# Patient Record
Sex: Female | Born: 1960 | Race: White | Hispanic: No | Smoking: Former smoker
Health system: Southern US, Community
[De-identification: ages and names within clinical notes are randomized; demographics above are authoritative.]

## PROBLEM LIST (undated history)

## (undated) DIAGNOSIS — M3500 Sicca syndrome, unspecified: Secondary | ICD-10-CM

## (undated) DIAGNOSIS — M359 Systemic involvement of connective tissue, unspecified: Secondary | ICD-10-CM

## (undated) DIAGNOSIS — I10 Essential (primary) hypertension: Secondary | ICD-10-CM

## (undated) HISTORY — PX: DILATION AND CURETTAGE OF UTERUS: SHX78

## (undated) HISTORY — DX: Sjogren syndrome, unspecified: M35.00

## (undated) HISTORY — DX: Essential (primary) hypertension: I10

## (undated) HISTORY — DX: Systemic involvement of connective tissue, unspecified: M35.9

---

## 2006-02-26 ENCOUNTER — Encounter: Admission: RE | Admit: 2006-02-26 | Discharge: 2006-02-26 | Payer: Self-pay | Admitting: Rheumatology

## 2015-09-11 ENCOUNTER — Other Ambulatory Visit (HOSPITAL_COMMUNITY): Payer: Self-pay | Admitting: Rheumatology

## 2015-09-11 ENCOUNTER — Ambulatory Visit (HOSPITAL_COMMUNITY)
Admission: RE | Admit: 2015-09-11 | Discharge: 2015-09-11 | Disposition: A | Discharge status: Home / Self Care | Payer: BLUE CROSS/BLUE SHIELD | Source: Ambulatory Visit | Attending: Rheumatology | Admitting: Rheumatology

## 2015-09-11 DIAGNOSIS — Z139 Encounter for screening, unspecified: Secondary | ICD-10-CM

## 2016-03-04 DIAGNOSIS — D72819 Decreased white blood cell count, unspecified: Secondary | ICD-10-CM

## 2016-03-18 ENCOUNTER — Ambulatory Visit (INDEPENDENT_AMBULATORY_CARE_PROVIDER_SITE_OTHER): Payer: BLUE CROSS/BLUE SHIELD | Admitting: Rheumatology

## 2016-03-18 DIAGNOSIS — M359 Systemic involvement of connective tissue, unspecified: Secondary | ICD-10-CM

## 2016-03-18 DIAGNOSIS — M3500 Sicca syndrome, unspecified: Secondary | ICD-10-CM | POA: Diagnosis not present

## 2016-03-18 DIAGNOSIS — Z09 Encounter for follow-up examination after completed treatment for conditions other than malignant neoplasm: Secondary | ICD-10-CM

## 2016-03-18 DIAGNOSIS — L408 Other psoriasis: Secondary | ICD-10-CM

## 2016-03-18 DIAGNOSIS — M19041 Primary osteoarthritis, right hand: Secondary | ICD-10-CM | POA: Diagnosis not present

## 2016-08-05 ENCOUNTER — Other Ambulatory Visit: Payer: Self-pay | Admitting: Rheumatology

## 2016-08-05 LAB — CBC WITH DIFFERENTIAL/PLATELET
BASOS PCT: 1 %
Basophils Absolute: 39 cells/uL (ref 0–200)
EOS PCT: 2 %
Eosinophils Absolute: 78 cells/uL (ref 15–500)
HCT: 38.3 % (ref 35.0–45.0)
Hemoglobin: 12.7 g/dL (ref 11.7–15.5)
LYMPHS PCT: 32 %
Lymphs Abs: 1248 cells/uL (ref 850–3900)
MCH: 29.2 pg (ref 27.0–33.0)
MCHC: 33.2 g/dL (ref 32.0–36.0)
MCV: 88 fL (ref 80.0–100.0)
MONOS PCT: 10 %
MPV: 9.5 fL (ref 7.5–12.5)
Monocytes Absolute: 390 cells/uL (ref 200–950)
NEUTROS ABS: 2145 {cells}/uL (ref 1500–7800)
Neutrophils Relative %: 55 %
PLATELETS: 210 10*3/uL (ref 140–400)
RBC: 4.35 MIL/uL (ref 3.80–5.10)
RDW: 14 % (ref 11.0–15.0)
WBC: 3.9 10*3/uL (ref 3.8–10.8)

## 2016-08-05 LAB — COMPLETE METABOLIC PANEL WITH GFR
ALBUMIN: 4.4 g/dL (ref 3.6–5.1)
ALK PHOS: 59 U/L (ref 33–130)
ALT: 22 U/L (ref 6–29)
AST: 18 U/L (ref 10–35)
BILIRUBIN TOTAL: 0.5 mg/dL (ref 0.2–1.2)
BUN: 15 mg/dL (ref 7–25)
CALCIUM: 9.4 mg/dL (ref 8.6–10.4)
CO2: 28 mmol/L (ref 20–31)
CREATININE: 1.07 mg/dL — AB (ref 0.50–1.05)
Chloride: 104 mmol/L (ref 98–110)
GFR, EST AFRICAN AMERICAN: 67 mL/min (ref >=60)
GFR, EST NON AFRICAN AMERICAN: 58 mL/min — AB (ref >=60)
Glucose, Bld: 103 mg/dL — ABNORMAL HIGH (ref 65–99)
Potassium: 3.8 mmol/L (ref 3.5–5.3)
Sodium: 141 mmol/L (ref 135–146)
TOTAL PROTEIN: 7.5 g/dL (ref 6.1–8.1)

## 2016-08-06 LAB — URINALYSIS, ROUTINE W REFLEX MICROSCOPIC
BILIRUBIN URINE: NEGATIVE
GLUCOSE, UA: NEGATIVE
HGB URINE DIPSTICK: NEGATIVE
Ketones, ur: NEGATIVE
LEUKOCYTES UA: NEGATIVE
Nitrite: NEGATIVE
PROTEIN: NEGATIVE
Specific Gravity, Urine: 1.02 (ref 1.001–1.035)
pH: 6 (ref 5.0–8.0)

## 2016-08-26 ENCOUNTER — Encounter: Payer: Self-pay | Admitting: Rheumatology

## 2016-08-26 ENCOUNTER — Ambulatory Visit (INDEPENDENT_AMBULATORY_CARE_PROVIDER_SITE_OTHER): Payer: Managed Care, Other (non HMO) | Admitting: Rheumatology

## 2016-08-26 VITALS — BP 123/80 | HR 86 | Resp 14 | Ht 67.0 in | Wt 174.0 lb

## 2016-08-26 DIAGNOSIS — M25562 Pain in left knee: Secondary | ICD-10-CM | POA: Diagnosis not present

## 2016-08-26 DIAGNOSIS — M19042 Primary osteoarthritis, left hand: Secondary | ICD-10-CM | POA: Diagnosis not present

## 2016-08-26 DIAGNOSIS — L409 Psoriasis, unspecified: Secondary | ICD-10-CM | POA: Diagnosis not present

## 2016-08-26 DIAGNOSIS — Z8739 Personal history of other diseases of the musculoskeletal system and connective tissue: Secondary | ICD-10-CM

## 2016-08-26 DIAGNOSIS — Z79899 Other long term (current) drug therapy: Secondary | ICD-10-CM

## 2016-08-26 DIAGNOSIS — M19041 Primary osteoarthritis, right hand: Secondary | ICD-10-CM | POA: Diagnosis not present

## 2016-08-26 DIAGNOSIS — Z8679 Personal history of other diseases of the circulatory system: Secondary | ICD-10-CM | POA: Insufficient documentation

## 2016-08-26 DIAGNOSIS — Z8639 Personal history of other endocrine, nutritional and metabolic disease: Secondary | ICD-10-CM

## 2016-08-26 DIAGNOSIS — Z872 Personal history of diseases of the skin and subcutaneous tissue: Secondary | ICD-10-CM | POA: Diagnosis not present

## 2016-08-26 DIAGNOSIS — M359 Systemic involvement of connective tissue, unspecified: Secondary | ICD-10-CM

## 2016-08-26 DIAGNOSIS — M3500 Sicca syndrome, unspecified: Secondary | ICD-10-CM

## 2016-08-26 MED ORDER — HYDROXYCHLOROQUINE SULFATE 200 MG PO TABS: 200.0000 mg | ORAL_TABLET | Freq: Every day | ORAL | 1 refills | Status: AC

## 2016-08-26 NOTE — Progress Notes (Signed)
Office Visit Note  Patient: Suzanne [name]             Date of Birth: Nov 03, 1960           MRN: [id number]             PCP: Ronita Hipps, MD Referring: Ronita Hipps, MD Visit Date: 08/26/2016 Occupation: '@GUAROCC' @    Subjective:  Follow-up Follow-up on autoimmune disease, high risk prescription  History of Present Illness: Suzanne [name] is a 56 y.o. female  Last seen 03/18/2016. Patient is doing well with the autoimmune disease. Her main problem is her eyes. She states that she is on Lotemax which is helping her eyes. I advised the patient that that medicine is not for dry eyes. Then she corrected herself says that she is using Systane. She does not have any dry mouth problems.  She is using Plaquenil 200 mg daily. The last time she had it filled it cost her 200. We discussed her accident, and patient is able to get 180 pills for approximately $100.  Patient's Plaquenil eye exam is normal and up-to-date as of July 2017 and will be due again in July 2018.  Otherwise she is doing well and she doesn't have any other issues or concerns.  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    Activities of Daily Living:  Patient reports morning stiffness for 15 minutes.   Patient Denies nocturnal pain.  Difficulty dressing/grooming: Denies Difficulty climbing stairs: Denies Difficulty getting out of chair: Denies Difficulty using hands for taps, buttons, cutlery, and/or writing: Denies   Review of Systems  Constitutional: Negative for fatigue.  HENT: Negative for mouth sores and mouth dryness.   Eyes: Negative for dryness.  Respiratory: Negative for shortness of breath.   Gastrointestinal: Negative for constipation and diarrhea.  Musculoskeletal: Negative for myalgias and myalgias.  Skin: Negative for sensitivity to sunlight.  Psychiatric/Behavioral: Negative for decreased concentration  and sleep disturbance.    PMFS History:  Patient Active Problem List   Diagnosis Date Noted  . Autoimmune disease ([place]) 08/26/2016  . High risk medication use 08/26/2016  . Psoriasis 08/26/2016  . Primary osteoarthritis of both hands 08/26/2016  . History of Sjogren's disease 08/26/2016  . Pain, joint, knee, left 08/26/2016  . History of hypertension 08/26/2016  . H/O rosacea 08/26/2016    No past medical history on file.  No family history on file. No past surgical history on file. Social History   Social History Narrative  . No narrative on file     Objective: Vital Signs: BP 123/80   Pulse 86   Resp 14   Ht '5\' 7"'  (1.702 m)   Wt 174 lb (78.9 kg)   BMI 27.25 kg/m    Physical Exam  Constitutional: She is oriented to person, place, and time. She appears well-developed and well-nourished.  HENT:  Head: Normocephalic and atraumatic.  Eyes: EOM are normal. Pupils are equal, round, and reactive to light.  Cardiovascular: Normal rate, regular rhythm and normal heart sounds.  Exam reveals no gallop and no friction rub.   No murmur heard. Pulmonary/Chest: Effort normal and breath sounds normal. She has no wheezes. She has no rales.  Abdominal: Soft. Bowel sounds are normal. She exhibits no distension. There is no tenderness. There is no guarding. No hernia.  Musculoskeletal: Normal range of motion. She exhibits no edema, tenderness or deformity.  Lymphadenopathy:  She has no cervical adenopathy.  Neurological: She is alert and oriented to person, place, and time. Coordination normal.  Skin: Skin is warm and dry. Capillary refill takes less than 2 seconds. No rash noted.  Psychiatric: She has a normal mood and affect. Her behavior is normal.  Nursing note and vitals reviewed.    Musculoskeletal Exam:  Full range of motion of all joints Grip strength is equal and strong bilaterally Fibromyalgia tender points are all absent  CDAI Exam: CDAI Homunculus Exam:   Joint  Counts:  CDAI Tender Joint count: 0 CDAI Swollen Joint count: 0     Investigation: Findings:  Labs from 03/04/2016 show ANA is negative, ANA cytoplasmic antibodies are negative.  P-ANCA is less than 1:20.  C-ANCA is less than 1:20, atypical P-ANCA is less than 1:20.    CBC with diff is normal.  Sed rate is pending.  Note that these labs were ordered through her hematologist and they were done in [place].    March 18, 2016 we will go ahead and order CMP with GFR and urinalysis because they were not done.  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 Sodium 08/05/2016 141  135 - 146 mmol/L Final  . Potassium 08/05/2016 3.8  3.5 - 5.3 mmol/L Final  . Chloride 08/05/2016 104  98 - 110 mmol/L Final  . CO2 08/05/2016 28  20 - 31 mmol/L Final  . Glucose, Bld 08/05/2016 103* 65 - 99 mg/dL Final  . BUN 08/05/2016 15  7 - 25 mg/dL Final  . Creat 08/05/2016 1.07* 0.50 - 1.05 mg/dL Final   Comment:   For patients > or = 58 years of age: The upper reference limit for Creatinine is approximately 13% higher for people identified as African-American.     . Total Bilirubin 08/05/2016 0.5  0.2 - 1.2 mg/dL Final  . Alkaline Phosphatase 08/05/2016 59  33 - 130 U/L Final  . AST 08/05/2016 18  10 - 35 U/L Final  . ALT 08/05/2016 22  6 - 29 U/L Final  . Total Protein 08/05/2016 7.5  6.1 - 8.1 g/dL Final  . Albumin 08/05/2016 4.4  3.6 - 5.1 g/dL Final  . Calcium 08/05/2016 9.4  8.6 - 10.4 mg/dL Final  . GFR, Est African American 08/05/2016 67  >=60 mL/min Final  . GFR, Est Non African American 08/05/2016 58* >=60 mL/min Final  . WBC 08/05/2016 3.9  3.8 - 10.8 K/uL Final  . RBC 08/05/2016 4.35  3.80 - 5.10 MIL/uL Final  . Hemoglobin 08/05/2016 12.7  11.7 - 15.5 g/dL Final  . HCT 08/05/2016 38.3  35.0 - 45.0 % Final  . MCV 08/05/2016 88.0  80.0 - 100.0 fL Final  . MCH 08/05/2016 29.2  27.0 - 33.0  pg Final  . MCHC 08/05/2016 33.2  32.0 - 36.0 g/dL Final  . RDW 08/05/2016 14.0  11.0 - 15.0 % Final  . Platelets 08/05/2016 210  140 - 400 K/uL Final  . MPV 08/05/2016 9.5  7.5 - 12.5 fL Final  . Neutro Abs 08/05/2016 2145  1,500 - 7,800 cells/uL Final  . Lymphs Abs 08/05/2016 1248  850 - 3,900 cells/uL Final  . Monocytes Absolute 08/05/2016 390  200 - 950 cells/uL Final  . Eosinophils Absolute 08/05/2016 78  15 - 500 cells/uL Final  . Basophils Absolute 08/05/2016 39  0 - 200 cells/uL  Final  . Neutrophils Relative % 08/05/2016 55  % Final  . Lymphocytes Relative 08/05/2016 32  % Final  . Monocytes Relative 08/05/2016 10  % Final  . Eosinophils Relative 08/05/2016 2  % Final  . Basophils Relative 08/05/2016 1  % Final  . Smear Review 08/05/2016 Criteria for review not met   Final  . Color, Urine 08/05/2016 YELLOW  YELLOW Final  . APPearance 08/05/2016 CLEAR  CLEAR Final  . Specific Gravity, Urine 08/05/2016 1.020  1.001 - 1.035 Final  . pH 08/05/2016 6.0  5.0 - 8.0 Final  . Glucose, UA 08/05/2016 NEGATIVE  NEGATIVE Final  . Bilirubin Urine 08/05/2016 NEGATIVE  NEGATIVE Final  . Ketones, ur 08/05/2016 NEGATIVE  NEGATIVE Final  . Hgb urine dipstick 08/05/2016 NEGATIVE  NEGATIVE Final  . Protein, ur 08/05/2016 NEGATIVE  NEGATIVE Final  . Nitrite 08/05/2016 NEGATIVE  NEGATIVE Final  . Leukocytes, UA 08/05/2016 NEGATIVE  NEGATIVE Final     Imaging: No results found.  Speciality Comments: No specialty comments available.    Procedures:  No procedures performed Allergies: Patient has no known allergies.   Assessment / Plan:     Visit Diagnoses: Autoimmune disease ([place]) - Positive Ro.  High risk medication use - PLQ- 257m daily  Psoriasis  Primary osteoarthritis of both hands  History of Sjogren's disease  Pain, joint, knee, left  History of hypertension  History of vitamin D deficiency  H/O rosacea   Plan: #1: Autoimmune disease. Patient SIvin Bootyis well  controlled overall except does need symptom relief with sustained for her dry eyes. Her dry mouth is not much of an issue.  #2: High risk prescription. Plaquenil 200 mg once a day. Patient is getting adequate control with once a day Plaquenil. Her [facility] and is due again July 2018.  #3: OA of the hands. Mild occasional discomfort.  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 I gave her 90 day supply of Plaquenil 20 mg daily with 1 refill. I also give her good Rx card  #6: Patient will do a CBC with differential, CMP with GFR, urinalysis upon her return in 5 months.  Orders: No orders of the defined types were placed in this encounter.  Meds ordered this encounter  Medications  . hydroxychloroquine (PLAQUENIL) 200 MG tablet    Sig: Take 1 tablet (200 mg total) by mouth daily.    Dispense:  90 tablet    Refill:  1    Order Specific Question:   Supervising Provider    Answer:   DBo Merino[[phone]   Face-to-face time spent with patient was 30 minutes. 50% of time was spent in counseling and coordination of care.  Follow-Up Instructions: Return in about 5 months (around 01/26/2017).   NEliezer Lofts PA-C  Note - This record has been created using [employer]  Chart creation errors have been sought, but may not always  have been located. Such creation errors do not reflect on  the standard of medical care.

## 2017-01-27 ENCOUNTER — Ambulatory Visit: Payer: Managed Care, Other (non HMO) | Admitting: Rheumatology

## 2017-03-26 NOTE — Progress Notes (Signed)
Office Visit Note  Patient: Suzanne [name]             Date of Birth: April 13, 1961           MRN: [id number]             PCP: Marylen Ponto, MD Referring: Marylen Ponto, MD Visit Date: 04/07/2017 Occupation: @    Subjective:  This stiffness in joints.   History of Present Illness: Suzanne [name] is a 56 y.o. female with history of autoimmune disease and osteoarthritis. She states she's been having some discomfort in her bilateral shoulders area and she also has discomfort in her right knee joint when she gets up from the chair. She denies any joint swelling. Her hands are doing okay currently.  Activities of Daily Living:  Patient reports morning stiffness for 15 minutes.   Patient Denies nocturnal pain.  Difficulty dressing/grooming: Denies Difficulty climbing stairs: Denies Difficulty getting out of chair: Denies Difficulty using hands for taps, buttons, cutlery, and/or writing: Denies   Review of Systems  Constitutional: Positive for fatigue. Negative for night sweats, weight gain, weight loss and weakness.  HENT: Negative.  Negative for mouth sores, trouble swallowing, trouble swallowing, mouth dryness and nose dryness.   Eyes: Positive for dryness. Negative for pain, redness and visual disturbance.  Respiratory: Negative for cough, shortness of breath and difficulty breathing.   Cardiovascular: Positive for hypertension. Negative for chest pain, palpitations, irregular heartbeat and swelling in legs/feet.  Gastrointestinal: Positive for constipation. Negative for blood in stool and diarrhea.  Endocrine: Negative for increased urination.  Genitourinary: Negative for vaginal dryness.  Musculoskeletal: Positive for arthralgias, joint pain and morning stiffness. Negative for joint swelling, myalgias, muscle weakness, muscle tenderness and myalgias.  Skin: Positive for rash. Negative for color change, hair loss, skin tightness, ulcers and sensitivity to sunlight.         psoriasis  Allergic/Immunologic: Negative for susceptible to infections.  Neurological: Positive for headaches. Negative for dizziness, numbness, memory loss and night sweats.  Hematological: Negative for swollen glands.  Psychiatric/Behavioral: Positive for depressed mood and sleep disturbance. The patient is not nervous/anxious.     PMFS History:  Patient Active Problem List   Diagnosis Date Noted  . Autoimmune disease ([facility]) 08/26/2016  . High risk medication use 08/26/2016  . Psoriasis 08/26/2016  . Primary osteoarthritis of both hands 08/26/2016  . History of Sjogren's disease 08/26/2016  . Pain, joint, knee, left 08/26/2016  . History of hypertension 08/26/2016  . H/O rosacea 08/26/2016    No past medical history on file.  No family history on file. No past surgical history on file. Social History   Social History Narrative  . No narrative on file     Objective: Vital Signs: BP 127/66 (BP Location: Left Arm, Patient Position: Sitting, Cuff Size: Normal)   Pulse (!) 59   Ht  (1.702 m)   Wt 172 lb (78 kg)   BMI 26.94 kg/m    Physical Exam  Constitutional: She is oriented to person, place, and time. She appears well-developed and well-nourished.  HENT:  Head: Normocephalic and atraumatic.  Eyes: Conjunctivae and EOM are normal.  Neck: Normal range of motion.  Cardiovascular: Normal rate, regular rhythm, normal heart sounds and intact distal pulses.   Pulmonary/Chest: Effort normal and breath sounds normal.  Abdominal: Soft. Bowel sounds are normal.  Lymphadenopathy:    She has no cervical adenopathy.  Neurological: She is alert and oriented to person, place,  and time.  Skin: Skin is warm and dry. Capillary refill takes less than 2 seconds.  Psychiatric: She has a normal mood and affect. Her behavior is normal.  Nursing note and vitals reviewed.    Musculoskeletal Exam: C-spine and thoracic lumbar spine good range of motion. Shoulder joints elbow  joints wrist joints are good range of motion. MCPs PIPs DIPs with good range of motion with no synovitis. Hip joints knee joints ankles MTPs PIPs DIPs with good range of motion with no synovitis.  CDAI Exam: CDAI Homunculus Exam:   Joint Counts:  CDAI Tender Joint count: 0 CDAI Swollen Joint count: 0  Global Assessments:  Patient Global Assessment: 3 Provider Global Assessment: 1  CDAI Calculated Score: 4    Investigation: Findings:  02/24/2017 CBC WBC 2.8 hemoglobin 12.3 platelets 189, CMP globulin low at 3.3, lipid panel LDL 118, HDL 46 02/03/2017 I exam by Dr. Georgann Housekeeper No evidence of Plaquenil toxicity. PLQ eye exam? CBC Latest Ref Rng & Units 08/05/2016  WBC 3.8 - 10.8 K/uL 3.9  Hemoglobin 11.7 - 15.5 g/dL 45.4  Hematocrit 09.8 - 45.0 % 38.3  Platelets 140 - 400 K/uL 210   CMP Latest Ref Rng & Units 08/05/2016  Glucose 65 - 99 mg/dL 119(J)  BUN 7 - 25 mg/dL 15  Creatinine 4.78 - 2.95 mg/dL 6.21(H)  Sodium 086 - 578 mmol/L 141  Potassium 3.5 - 5.3 mmol/L 3.8  Chloride 98 - 110 mmol/L 104  CO2 20 - 31 mmol/L 28  Calcium 8.6 - 10.4 mg/dL 9.4  Total Protein 6.1 - 8.1 g/dL 7.5  Total Bilirubin 0.2 - 1.2 mg/dL 0.5  Alkaline Phos 33 - 130 U/L 59  AST 10 - 35 U/L 18  ALT 6 - 29 U/L 22    Imaging: No results found.  Speciality Comments: No specialty comments available.    Procedures:  No procedures performed Allergies: Patient has no known allergies.   Assessment / Plan:     Visit Diagnoses: Autoimmune disease ([facility]) - +ANA, +RF, +RO, +LA, +ACE. h/o sicca symptoms, malar rash, fatigue, neutropenia. Patient is clinically doing well. She continues to have some fatigue. I will obtain following labs today.  High risk medication use - PLQ 200 mg by mouth daily. Her last eye exam 02/11/2017 was normal. She has neutropenia on the recent labs. Her to PBC count usually fluctuates.  Psoriasis: She currently does not have any active lesions.  Primary osteoarthritis of  both hands: She has some stiffness but not very symptomatic.  Right knee pain: She has no warmth swelling or effusion on examination. Some knee joint and muscle strengthening exercises were given.  History of vitamin D deficiency: She's been taking supplements and will check the value today.  History of hypertension: Her blood pressure is controlled.   History of rosacea    Orders: Orders Placed This Encounter  Procedures  . Serum protein electrophoresis with reflex  . Rheumatoid factor  . C3 and C4  . ANA  . Urinalysis, Routine w reflex microscopic  . VITAMIN D 25 Hydroxy (Vit-D Deficiency, Fractures)  . Anti-DNA antibody, double-stranded  . Sedimentation rate  . CBC with Differential/Platelet  . COMPLETE METABOLIC PANEL WITH GFR   Meds ordered this encounter  Medications  . hydroxychloroquine (PLAQUENIL) 200 MG tablet    Sig: Take 1 tablet (200 mg total) by mouth daily.    Dispense:  90 tablet    Refill:  1    Face-to-face time spent with patient  was 30 minutes. Greater than 50% of time was spent in counseling and coordination of care.  Follow-Up Instructions: Return in about 5 months (around 09/05/2017) for Autoimmune disease.   Pollyann Savoy, MD  Note - This record has been created using [employer].  Chart creation errors have been sought, but may not always  have been located. Such creation errors do not reflect on  the standard of medical care.

## 2017-04-07 ENCOUNTER — Encounter: Payer: Self-pay | Admitting: Rheumatology

## 2017-04-07 ENCOUNTER — Encounter (INDEPENDENT_AMBULATORY_CARE_PROVIDER_SITE_OTHER): Payer: Self-pay

## 2017-04-07 ENCOUNTER — Ambulatory Visit (INDEPENDENT_AMBULATORY_CARE_PROVIDER_SITE_OTHER): Payer: Managed Care, Other (non HMO) | Admitting: Rheumatology

## 2017-04-07 VITALS — BP 127/66 | HR 59 | Ht 67.0 in | Wt 172.0 lb

## 2017-04-07 DIAGNOSIS — D8989 Other specified disorders involving the immune mechanism, not elsewhere classified: Secondary | ICD-10-CM

## 2017-04-07 DIAGNOSIS — L409 Psoriasis, unspecified: Secondary | ICD-10-CM | POA: Diagnosis not present

## 2017-04-07 DIAGNOSIS — G8929 Other chronic pain: Secondary | ICD-10-CM | POA: Diagnosis not present

## 2017-04-07 DIAGNOSIS — R5383 Other fatigue: Secondary | ICD-10-CM

## 2017-04-07 DIAGNOSIS — Z8679 Personal history of other diseases of the circulatory system: Secondary | ICD-10-CM | POA: Diagnosis not present

## 2017-04-07 DIAGNOSIS — Z872 Personal history of diseases of the skin and subcutaneous tissue: Secondary | ICD-10-CM

## 2017-04-07 DIAGNOSIS — M25561 Pain in right knee: Secondary | ICD-10-CM

## 2017-04-07 DIAGNOSIS — Z79899 Other long term (current) drug therapy: Secondary | ICD-10-CM | POA: Diagnosis not present

## 2017-04-07 DIAGNOSIS — Z8639 Personal history of other endocrine, nutritional and metabolic disease: Secondary | ICD-10-CM

## 2017-04-07 DIAGNOSIS — M19042 Primary osteoarthritis, left hand: Secondary | ICD-10-CM | POA: Diagnosis not present

## 2017-04-07 DIAGNOSIS — M19041 Primary osteoarthritis, right hand: Secondary | ICD-10-CM

## 2017-04-07 DIAGNOSIS — M359 Systemic involvement of connective tissue, unspecified: Secondary | ICD-10-CM

## 2017-04-07 MED ORDER — HYDROXYCHLOROQUINE SULFATE 200 MG PO TABS: 200.0000 mg | ORAL_TABLET | Freq: Every day | ORAL | 1 refills | Status: DC

## 2017-04-07 NOTE — Patient Instructions (Addendum)
Standing Labs We placed an order today for your standing lab work.    Please come back and get your standing labs in [place] Knee Exercises Ask your health care provider which exercises are safe for you. Do exercises exactly as told by your health care provider and adjust them as directed. It is normal to feel mild stretching, pulling, tightness, or discomfort as you do these exercises, but you should stop right away if you feel sudden pain or your pain gets worse.Do not begin these exercises until told by your health care provider. STRETCHING AND RANGE OF MOTION EXERCISES These exercises warm up your muscles and joints and improve the movement and flexibility of your knee. These exercises also help to relieve pain, numbness, and tingling. Exercise A: Knee Extension, Prone 1. Lie on your abdomen on a bed. 2. Place your left / right knee just beyond the edge of the surface so your knee is not on the bed. You can put a towel under your left / right thigh just above your knee for comfort. 3. Relax your leg muscles and allow gravity to straighten your knee. You should feel a stretch behind your left / right knee. 4. Hold this position for __________ seconds. 5. Scoot up so your knee is supported between repetitions. Repeat __________ times. Complete this stretch __________ times a day. Exercise B: Knee Flexion, Active  1. Lie on your back with both knees straight. If this causes back discomfort, bend your left / right knee so your foot is flat on the floor. 2. Slowly slide your left / right heel back toward your buttocks until you feel a gentle stretch in the front of your knee or thigh. 3. Hold this position for __________ seconds. 4. Slowly slide your left / right heel back to the starting position. Repeat __________ times. Complete this exercise __________ times a day. Exercise C: Quadriceps, Prone  1. Lie on your abdomen on a firm surface, such as a bed or padded floor. 2. Bend your left / right  knee and hold your ankle. If you cannot reach your ankle or pant leg, loop a belt around your foot and grab the belt instead. 3. Gently pull your heel toward your buttocks. Your knee should not slide out to the side. You should feel a stretch in the front of your thigh and knee. 4. Hold this position for __________ seconds. Repeat __________ times. Complete this stretch __________ times a day. Exercise D: Hamstring, Supine 1. Lie on your back. 2. Loop a belt or towel over the ball of your left / right foot. The ball of your foot is on the walking surface, right under your toes. 3. Straighten your left / right knee and slowly pull on the belt to raise your leg until you feel a gentle stretch behind your knee. ? Do not let your left / right knee bend while you do this. ? Keep your other leg flat on the floor. 4. Hold this position for __________ seconds. Repeat __________ times. Complete this stretch __________ times a day. STRENGTHENING EXERCISES These exercises build strength and endurance in your knee. Endurance is the ability to use your muscles for a long time, even after they get tired. Exercise E: Quadriceps, Isometric  1. Lie on your back with your left / right leg extended and your other knee bent. Put a rolled towel or small pillow under your knee if told by your health care provider. 2. Slowly tense the muscles in the front  of your left / right thigh. You should see your kneecap slide up toward your hip or see increased dimpling just above the knee. This motion will push the back of the knee toward the floor. 3. For __________ seconds, keep the muscle as tight as you can without increasing your pain. 4. Relax the muscles slowly and completely. Repeat __________ times. Complete this exercise __________ times a day. Exercise F: Straight Leg Raises - Quadriceps 1. Lie on your back with your left / right leg extended and your other knee bent. 2. Tense the muscles in the front of your left  / right thigh. You should see your kneecap slide up or see increased dimpling just above the knee. Your thigh may even shake a bit. 3. Keep these muscles tight as you raise your leg 4-6 inches (10-15 cm) off the floor. Do not let your knee bend. 4. Hold this position for __________ seconds. 5. Keep these muscles tense as you lower your leg. 6. Relax your muscles slowly and completely after each repetition. Repeat __________ times. Complete this exercise __________ times a day. Exercise G: Hamstring, Isometric 1. Lie on your back on a firm surface. 2. Bend your left / right knee approximately __________ degrees. 3. Dig your left / right heel into the surface as if you are trying to pull it toward your buttocks. Tighten the muscles in the back of your thighs to dig as hard as you can without increasing any pain. 4. Hold this position for __________ seconds. 5. Release the tension gradually and allow your muscles to relax completely for __________ seconds after each repetition. Repeat __________ times. Complete this exercise __________ times a day. Exercise H: Hamstring Curls  If told by your health care provider, do this exercise while wearing ankle weights. Begin with __________ weights. Then increase the weight by 1 lb (0.5 kg) increments. Do not wear ankle weights that are more than __________. 1. Lie on your abdomen with your legs straight. 2. Bend your left / right knee as far as you can without feeling pain. Keep your hips flat against the floor. 3. Hold this position for __________ seconds. 4. Slowly lower your leg to the starting position.  Repeat __________ times. Complete this exercise __________ times a day. Exercise I: Squats (Quadriceps) 1. Stand in front of a table, with your feet and knees pointing straight ahead. You may rest your hands on the table for balance but not for support. 2. Slowly bend your knees and lower your hips like you are going to sit in a chair. ? Keep your  weight over your heels, not over your toes. ? Keep your lower legs upright so they are parallel with the table legs. ? Do not let your hips go lower than your knees. ? Do not bend lower than told by your health care provider. ? If your knee pain increases, do not bend as low. 3. Hold the squat position for __________ seconds. 4. Slowly push with your legs to return to standing. Do not use your hands to pull yourself to standing. Repeat __________ times. Complete this exercise __________ times a day. Exercise J: Wall Slides (Quadriceps)  1. Lean your back against a smooth wall or door while you walk your feet out 18-24 inches (46-61 cm) from it. 2. Place your feet hip-width apart. 3. Slowly slide down the wall or door until your knees bend __________ degrees. Keep your knees over your heels, not over your toes. Keep your knees in  line with your hips. 4. Hold for __________ seconds. Repeat __________ times. Complete this exercise __________ times a day. Exercise K: Straight Leg Raises - Hip Abductors 1. Lie on your side with your left / right leg in the top position. Lie so your head, shoulder, knee, and hip line up. You may bend your bottom knee to help you keep your balance. 2. Roll your hips slightly forward so your hips are stacked directly over each other and your left / right knee is facing forward. 3. Leading with your heel, lift your top leg 4-6 inches (10-15 cm). You should feel the muscles in your outer hip lifting. ? Do not let your foot drift forward. ? Do not let your knee roll toward the ceiling. 4. Hold this position for __________ seconds. 5. Slowly return your leg to the starting position. 6. Let your muscles relax completely after each repetition. Repeat __________ times. Complete this exercise __________ times a day. Exercise L: Straight Leg Raises - Hip Extensors 1. Lie on your abdomen on a firm surface. You can put a pillow under your hips if that is more  comfortable. 2. Tense the muscles in your buttocks and lift your left / right leg about 4-6 inches (10-15 cm). Keep your knee straight as you lift your leg. 3. Hold this position for __________ seconds. 4. Slowly lower your leg to the starting position. 5. Let your leg relax completely after each repetition. Repeat __________ times. Complete this exercise __________ times a day. This information is not intended to replace advice given to you by your health care provider. Make sure you discuss any questions you have with your health care provider. Document Released: 04/17/2005 Document Revised: 02/26/2016 Document Reviewed: 04/09/2015 Elsevier Interactive Patient Education  2018 [employer]. [place]  We have open lab Monday through Friday from 8:30-11:30 AM and 1:30-4 PM at the office of Dr. Pollyann Savoy.   The office is located at [address], [place], [place] [address] No appointment is necessary.   Labs are drawn by [facility].  You may receive a bill from [facility] for your lab work. If you have any questions regarding directions or hours of operation,  please call [phone].

## 2017-04-11 LAB — URINALYSIS, ROUTINE W REFLEX MICROSCOPIC
BACTERIA UA: NONE SEEN /HPF
BILIRUBIN URINE: NEGATIVE
Glucose, UA: NEGATIVE
HGB URINE DIPSTICK: NEGATIVE
Hyaline Cast: NONE SEEN /LPF
NITRITE: NEGATIVE
PH: 5.5 (ref 5.0–8.0)
Protein, ur: NEGATIVE
RBC / HPF: NONE SEEN /HPF (ref 0–2)
SPECIFIC GRAVITY, URINE: 1.024 (ref 1.001–1.03)

## 2017-04-11 LAB — VITAMIN D 25 HYDROXY (VIT D DEFICIENCY, FRACTURES): Vit D, 25-Hydroxy: 47 ng/mL (ref 30–100)

## 2017-04-11 LAB — SEDIMENTATION RATE: Sed Rate: 25 mm/h (ref 0–30)

## 2017-04-11 LAB — PROTEIN ELECTROPHORESIS, SERUM, WITH REFLEX
ALPHA 1: 0.3 g/dL (ref 0.2–0.3)
Albumin ELP: 4.7 g/dL (ref 3.8–4.8)
Alpha 2: 0.7 g/dL (ref 0.5–0.9)
BETA 2: 0.3 g/dL (ref 0.2–0.5)
Beta Globulin: 0.5 g/dL (ref 0.4–0.6)
GAMMA GLOBULIN: 1.8 g/dL — AB (ref 0.8–1.7)
Total Protein: 8.2 g/dL — ABNORMAL HIGH (ref 6.1–8.1)

## 2017-04-11 LAB — ANA: ANA: POSITIVE — AB

## 2017-04-11 LAB — ANTI-NUCLEAR AB-TITER (ANA TITER): ANA Titer 1: 1:80 {titer} — ABNORMAL HIGH

## 2017-04-11 LAB — C3 AND C4
C3 COMPLEMENT: 132 mg/dL (ref 83–193)
C4 COMPLEMENT: 28 mg/dL (ref 15–57)

## 2017-04-11 LAB — ANTI-DNA ANTIBODY, DOUBLE-STRANDED: DS DNA AB: 1 [IU]/mL

## 2017-04-11 LAB — RHEUMATOID FACTOR: Rhuematoid fact SerPl-aCnc: <14 IU/mL (ref <14)

## 2017-04-11 LAB — IFE INTERPRETATION: Immunofix Electr Int: NOT DETECTED

## 2017-04-11 NOTE — Progress Notes (Signed)
Labs are stable. Do not indicate autoimmune flare. No change in therapy recommended.

## 2017-07-14 IMAGING — DX DG CHEST 2V
2 series · 2 of 2 positions shown · non-contrast
Comparison: 02/26/2006

CLINICAL DATA: Screening.  Beginning immunosuppressive therapy

EXAM:
CHEST  2 VIEW

[w chest pa]
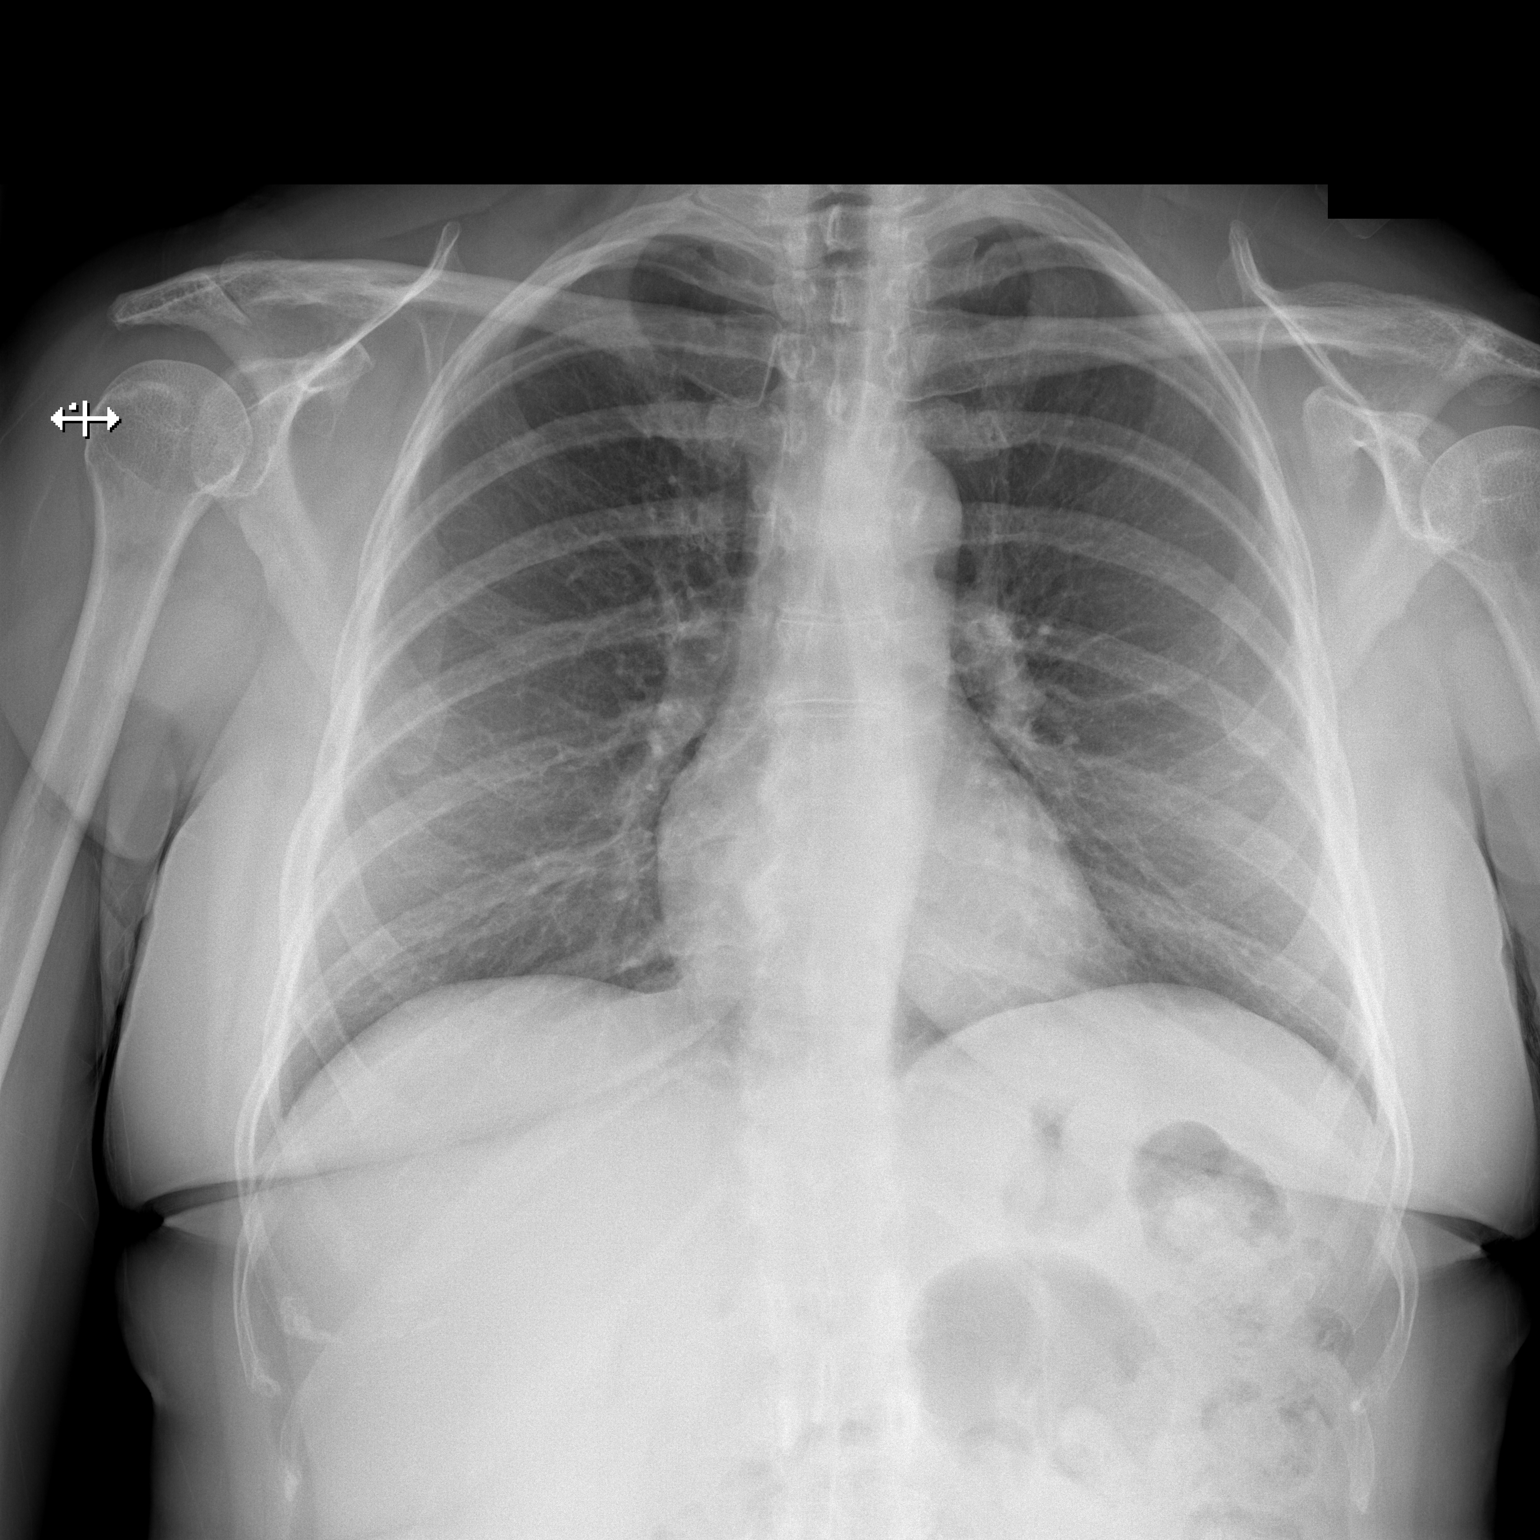

[w chest lat]
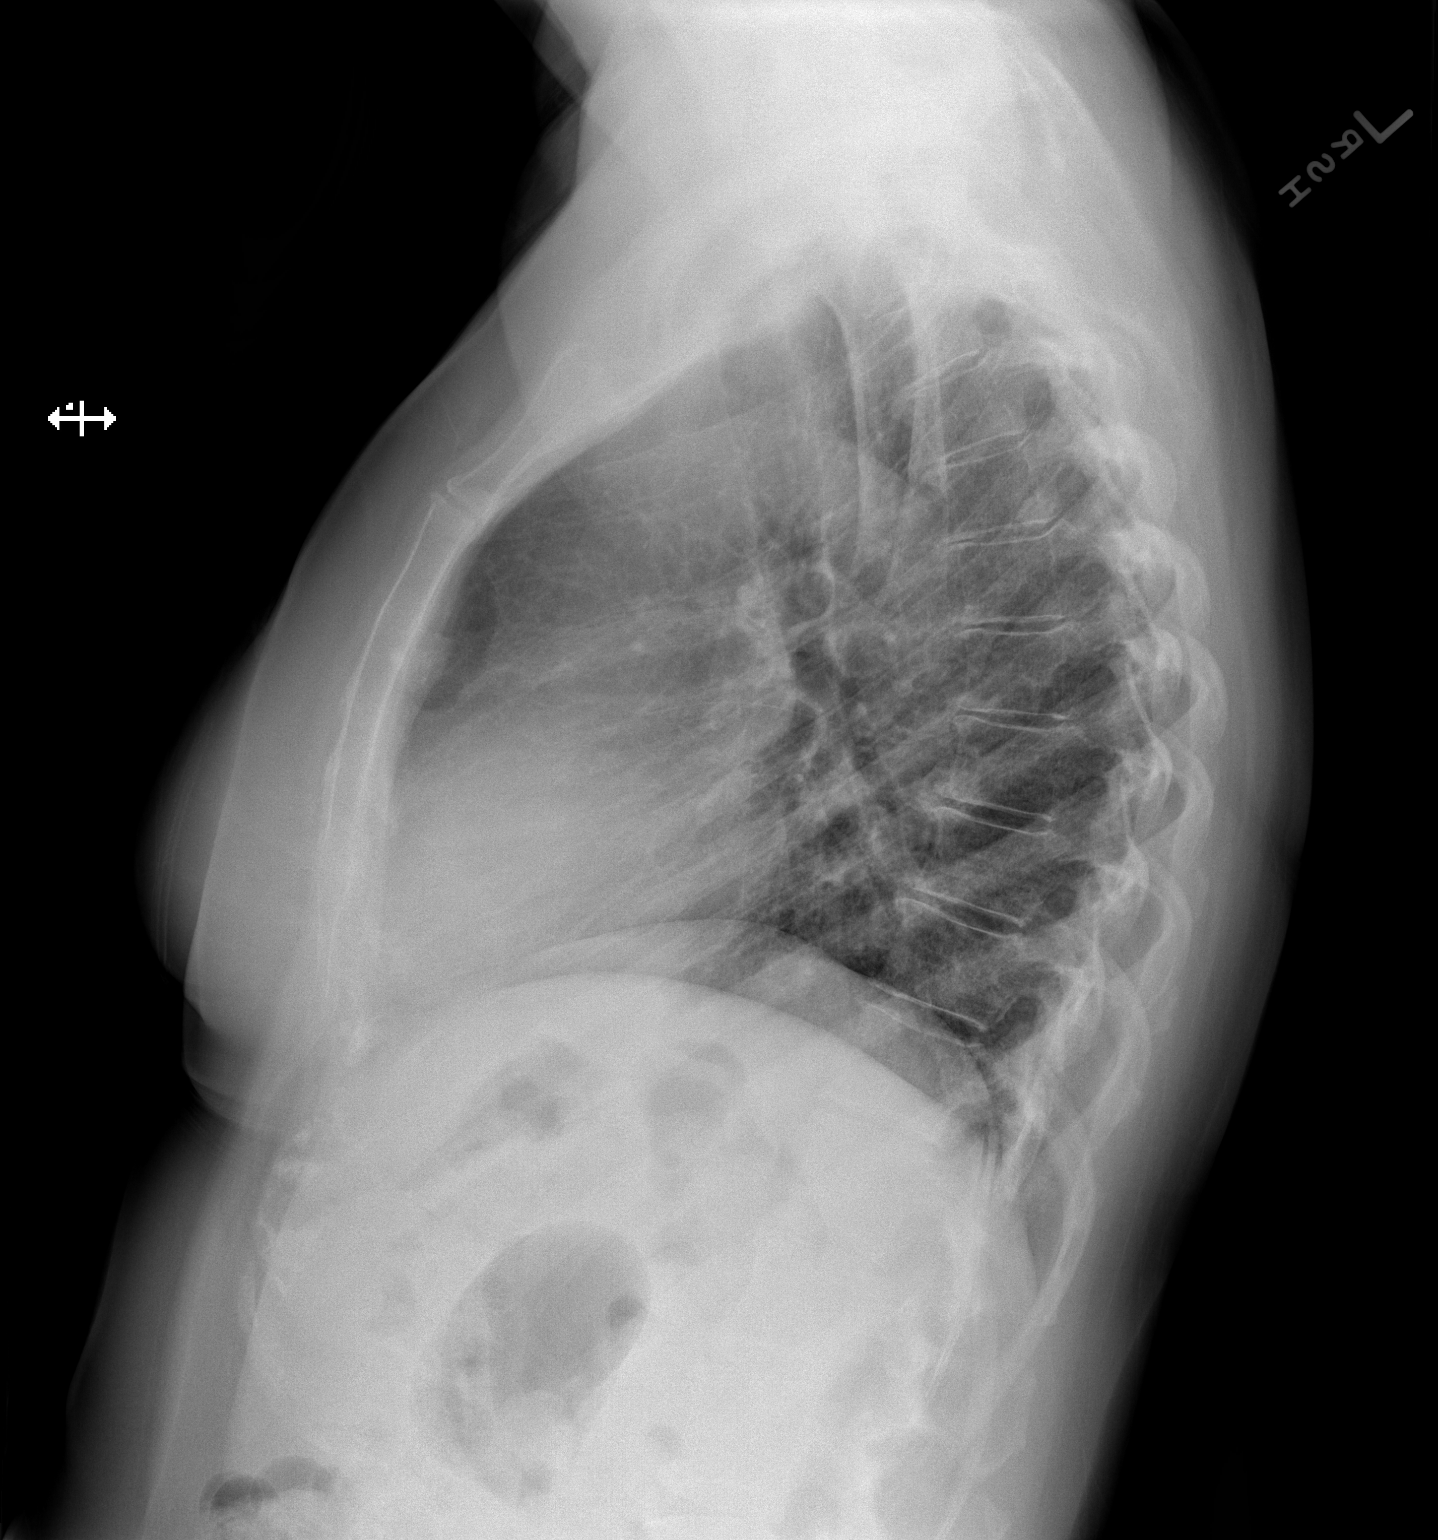

[2 of 2 positions shown; findings below may reference images not displayed]

FINDINGS: Heart and mediastinal contours are within normal limits. No focal
opacities or effusions. No acute bony abnormality.
IMPRESSION: No active cardiopulmonary disease.

## 2017-08-25 NOTE — Progress Notes (Signed)
Office Visit Note  Patient: Suzanne [name]             Date of Birth: 10/27/1960           MRN: [id number]             PCP: Ronita Hipps, MD Referring: Ronita Hipps, MD Visit Date: 09/08/2017 Occupation: '@GUAROCC' @    Subjective:  Medication Management   History of Present Illness: Suzanne [name] is a 57 y.o. female with history of autoimmune disease and psoriasis.  She states she started having some pain in the coccyx area about 3 months ago.  She has been having increased discomfort in the lower back in the coccyx region for the last 2 days.  She has done some activities in the house which could have triggered the pain.  He denies any joint swelling.  There is no increased discomfort in her hands.  He denies any new psoriasis lesions.  She continues to have some rash on her face and dry eyes.  Activities of Daily Living:  Patient reports morning stiffness for 30 minutes.   Patient Denies nocturnal pain.  Difficulty dressing/grooming: Denies Difficulty climbing stairs: Denies Difficulty getting out of chair: Denies Difficulty using hands for taps, buttons, cutlery, and/or writing: Denies   Review of Systems  Constitutional: Positive for fatigue. Negative for night sweats, weight gain and weight loss.  HENT: Negative for mouth sores, trouble swallowing, trouble swallowing, mouth dryness and nose dryness.   Eyes: Positive for dryness. Negative for pain, redness and visual disturbance.  Respiratory: Negative for cough, shortness of breath and difficulty breathing.   Cardiovascular: Negative for chest pain, palpitations, hypertension, irregular heartbeat and swelling in legs/feet.  Gastrointestinal: Positive for constipation. Negative for blood in stool and diarrhea.  Endocrine: Negative for increased urination.  Genitourinary: Negative for vaginal dryness.  Musculoskeletal: Positive for arthralgias, joint pain and morning stiffness. Negative for joint swelling, myalgias,  muscle weakness, muscle tenderness and myalgias.  Skin: Positive for rash. Negative for color change, hair loss, skin tightness, ulcers and sensitivity to sunlight.  Allergic/Immunologic: Negative for susceptible to infections.  Neurological: Negative for dizziness, memory loss, night sweats and weakness.  Hematological: Negative for swollen glands.  Psychiatric/Behavioral: Positive for depressed mood and sleep disturbance. The patient is nervous/anxious.     PMFS History:  Patient Active Problem List   Diagnosis Date Noted  . Autoimmune disease ([place]) 08/26/2016  . High risk medication use 08/26/2016  . Psoriasis 08/26/2016  . Primary osteoarthritis of both hands 08/26/2016  . History of Sjogren's disease 08/26/2016  . Pain, joint, knee, left 08/26/2016  . History of hypertension 08/26/2016  . H/O rosacea 08/26/2016    Past Medical History:  Diagnosis Date  . Hypertension     Family History  Problem Relation Age of Onset  . Hypertension Mother   . Stroke Mother   . Hypercholesterolemia Mother   . Cancer Father    History reviewed. No pertinent surgical history. Social History   Social History Narrative  . Not on file     Objective: Vital Signs: BP (!) 155/76 (BP Location: Left Arm, Patient Position: Sitting, Cuff Size: Large)   Pulse 66   Resp 14   Ht '5\' 7"'  (1.702 m)   Wt 182 lb (82.6 kg)   BMI 28.51 kg/m    Physical Exam  Constitutional: She is oriented to person, place, and time. She appears well-developed and well-nourished.  HENT:  Head: Normocephalic and atraumatic.  Eyes: Conjunctivae and EOM are normal.  Neck: Normal range of motion.  Cardiovascular: Normal rate, regular rhythm, normal heart sounds and intact distal pulses.  Pulmonary/Chest: Effort normal and breath sounds normal.  Abdominal: Soft. Bowel sounds are normal.  Lymphadenopathy:    She has no cervical adenopathy.  Neurological: She is alert and oriented to person, place, and time.  Skin:  Skin is warm and dry. Capillary refill takes less than 2 seconds. There is erythema.  Over face  Psychiatric: She has a normal mood and affect. Her behavior is normal.  Nursing note and vitals reviewed.    Musculoskeletal Exam: C-spine thoracic lumbar spine good range of motion.  Shoulder joints elbow joints wrist joint MCPs PIPs DIPs with good range of motion.  She has mild DIP thickening in her hands.  She has some discomfort on palpation over her coccyx and sacral region.  Hip joints knee joints ankles MTPs PIPs with good range of motion with no synovitis.  CDAI Exam: No CDAI exam completed.    Investigation: No additional findings.  CBC    Component Value Date/Time   WBC 3.9 08/05/2016 1240   RBC 4.35 08/05/2016 1240   HGB 12.7 08/05/2016 1240   HCT 38.3 08/05/2016 1240   PLT 210 08/05/2016 1240   MCV 88.0 08/05/2016 1240   MCH 29.2 08/05/2016 1240   MCHC 33.2 08/05/2016 1240   RDW 14.0 08/05/2016 1240   LYMPHSABS 1,248 08/05/2016 1240   MONOABS 390 08/05/2016 1240   EOSABS 78 08/05/2016 1240   BASOSABS 39 08/05/2016 1240   CMP     Component Value Date/Time   NA 141 08/05/2016 1240   K 3.8 08/05/2016 1240   CL 104 08/05/2016 1240   CO2 28 08/05/2016 1240   GLUCOSE 103 (H) 08/05/2016 1240   BUN 15 08/05/2016 1240   CREATININE 1.07 (H) 08/05/2016 1240   CALCIUM 9.4 08/05/2016 1240   PROT 8.2 (H) 04/07/2017 0845   ALBUMIN 4.4 08/05/2016 1240   AST 18 08/05/2016 1240   ALT 22 08/05/2016 1240   ALKPHOS 59 08/05/2016 1240   BILITOT 0.5 08/05/2016 1240   GFRNONAA 58 (L) 08/05/2016 1240   GFRAA 67 08/05/2016 1240  UA showed few WBCs but no bacteria, ANA 1: 80 speckled, dsDNA negative, C3-C4 normal, ESR 25 normal, RF negative, vitamin D 47, IFE normal PLQ eye exam: 02/03/2017 Imaging: No results found.  Speciality Comments: No specialty comments available.    Procedures:  No procedures performed Allergies: Patient has no known allergies.   Assessment / Plan:      Visit Diagnoses: Autoimmune disease ([place]) - +ANA, +RF, +RO, +LA, +ACE. h/o sicca symptoms, malar rash, fatigue, neutropenia -she continues to have some malar rash and sicca symptoms.  Fatigue continues.  Her last labs did not show any flare of autoimmune disease.  Plan: Urinalysis, Routine w reflex microscopic, Anti-DNA antibody, double-stranded, C3 and C4, Sedimentation rate  High risk medication use - PLQ 200 mg by mouth daily.  She is on lower dose secondary to neutropenia in the past.  I will check her labs again today.  Coccygodynia no injury but having some coccygodynia.  Advised to sit on the cushion.  If her symptoms persist she will notify us.  Primary osteoarthritis of both hands: Joint protection was discussed.  Psoriasis: She is occasional patches.  History of vitamin D deficiency -she is on supplement.  Plan: VITAMIN D 25 Hydroxy (Vit-D Deficiency, Fractures)  H/O rosacea  History of hypertension: Her  blood pressure is elevated today.  She will followed closely.   Orders: Orders Placed This Encounter  Procedures  . Urinalysis, Routine w reflex microscopic  . Anti-DNA antibody, double-stranded  . C3 and C4  . Sedimentation rate  . CBC with Differential/Platelet  . COMPLETE METABOLIC PANEL WITH GFR   No orders of the defined types were placed in this encounter.   Face-to-face time spent with patient was 30 minutes.  Greater than 50% of time was spent in counseling and coordination of care.  Follow-Up Instructions: Return in about 5 months (around 02/08/2018) for Autoimmune disease, Osteoarthritis,Ps.   Bo Merino, MD  Note - This record has been created using Editor, commissioning.  Chart creation errors have been sought, but may not always  have been located. Such creation errors do not reflect on  the standard of medical care.

## 2017-09-08 ENCOUNTER — Encounter: Payer: Self-pay | Admitting: Rheumatology

## 2017-09-08 ENCOUNTER — Ambulatory Visit (INDEPENDENT_AMBULATORY_CARE_PROVIDER_SITE_OTHER): Payer: Managed Care, Other (non HMO) | Admitting: Rheumatology

## 2017-09-08 VITALS — BP 155/76 | HR 66 | Resp 14 | Ht 67.0 in | Wt 182.0 lb

## 2017-09-08 DIAGNOSIS — D8989 Other specified disorders involving the immune mechanism, not elsewhere classified: Secondary | ICD-10-CM | POA: Diagnosis not present

## 2017-09-08 DIAGNOSIS — M533 Sacrococcygeal disorders, not elsewhere classified: Secondary | ICD-10-CM | POA: Diagnosis not present

## 2017-09-08 DIAGNOSIS — M19041 Primary osteoarthritis, right hand: Secondary | ICD-10-CM

## 2017-09-08 DIAGNOSIS — Z79899 Other long term (current) drug therapy: Secondary | ICD-10-CM | POA: Diagnosis not present

## 2017-09-08 DIAGNOSIS — Z8639 Personal history of other endocrine, nutritional and metabolic disease: Secondary | ICD-10-CM

## 2017-09-08 DIAGNOSIS — L409 Psoriasis, unspecified: Secondary | ICD-10-CM

## 2017-09-08 DIAGNOSIS — M359 Systemic involvement of connective tissue, unspecified: Secondary | ICD-10-CM

## 2017-09-08 DIAGNOSIS — M19042 Primary osteoarthritis, left hand: Secondary | ICD-10-CM | POA: Diagnosis not present

## 2017-09-08 DIAGNOSIS — Z872 Personal history of diseases of the skin and subcutaneous tissue: Secondary | ICD-10-CM | POA: Diagnosis not present

## 2017-09-08 DIAGNOSIS — Z8679 Personal history of other diseases of the circulatory system: Secondary | ICD-10-CM | POA: Diagnosis not present

## 2017-09-09 LAB — URINALYSIS, ROUTINE W REFLEX MICROSCOPIC
Bilirubin Urine: NEGATIVE
Glucose, UA: NEGATIVE
Hgb urine dipstick: NEGATIVE
Ketones, ur: NEGATIVE
LEUKOCYTES UA: NEGATIVE
Nitrite: NEGATIVE
PROTEIN: NEGATIVE
Specific Gravity, Urine: 1.019 (ref 1.001–1.03)
pH: 6.5 (ref 5.0–8.0)

## 2017-09-09 LAB — CBC WITH DIFFERENTIAL/PLATELET
Basophils Absolute: 29 cells/uL (ref 0–200)
Basophils Relative: 0.7 %
EOS PCT: 1.9 %
Eosinophils Absolute: 80 cells/uL (ref 15–500)
HCT: 37.2 % (ref 35.0–45.0)
HEMOGLOBIN: 12.9 g/dL (ref 11.7–15.5)
Lymphs Abs: 1058 cells/uL (ref 850–3900)
MCH: 29.6 pg (ref 27.0–33.0)
MCHC: 34.7 g/dL (ref 32.0–36.0)
MCV: 85.3 fL (ref 80.0–100.0)
MONOS PCT: 8.8 %
MPV: 10.1 fL (ref 7.5–12.5)
NEUTROS ABS: 2663 {cells}/uL (ref 1500–7800)
NEUTROS PCT: 63.4 %
PLATELETS: 259 10*3/uL (ref 140–400)
RBC: 4.36 10*6/uL (ref 3.80–5.10)
RDW: 13.2 % (ref 11.0–15.0)
TOTAL LYMPHOCYTE: 25.2 %
WBC mixed population: 370 cells/uL (ref 200–950)
WBC: 4.2 10*3/uL (ref 3.8–10.8)

## 2017-09-09 LAB — C3 AND C4
C3 Complement: 145 mg/dL (ref 83–193)
C4 COMPLEMENT: 29 mg/dL (ref 15–57)

## 2017-09-09 LAB — COMPLETE METABOLIC PANEL WITH GFR
AG RATIO: 1.2 (calc) (ref 1.0–2.5)
ALT: 21 U/L (ref 6–29)
AST: 17 U/L (ref 10–35)
Albumin: 4.4 g/dL (ref 3.6–5.1)
Alkaline phosphatase (APISO): 78 U/L (ref 33–130)
BILIRUBIN TOTAL: 0.4 mg/dL (ref 0.2–1.2)
BUN: 19 mg/dL (ref 7–25)
CHLORIDE: 103 mmol/L (ref 98–110)
CO2: 28 mmol/L (ref 20–32)
Calcium: 9.8 mg/dL (ref 8.6–10.4)
Creat: 0.94 mg/dL (ref 0.50–1.05)
GFR, EST AFRICAN AMERICAN: 78 mL/min/{1.73_m2} (ref >=60)
GFR, Est Non African American: 67 mL/min/{1.73_m2} (ref >=60)
GLOBULIN: 3.6 g/dL (ref 1.9–3.7)
Glucose, Bld: 91 mg/dL (ref 65–99)
POTASSIUM: 3.9 mmol/L (ref 3.5–5.3)
SODIUM: 140 mmol/L (ref 135–146)
TOTAL PROTEIN: 8 g/dL (ref 6.1–8.1)

## 2017-09-09 LAB — SEDIMENTATION RATE: SED RATE: 29 mm/h (ref 0–30)

## 2017-09-09 LAB — ANTI-DNA ANTIBODY, DOUBLE-STRANDED: ds DNA Ab: 1 IU/mL

## 2017-09-09 NOTE — Progress Notes (Signed)
WNL

## 2017-11-14 ENCOUNTER — Telehealth: Payer: Self-pay | Admitting: Rheumatology

## 2017-11-14 NOTE — Telephone Encounter (Signed)
Patient calling in reference to Plaquenil RX that was going to be mailed to patient. She requested this in March, and has never received it. Please call to advise. Patient requesting rx to be sent into [facility] on [address] at this time.

## 2017-11-17 MED ORDER — HYDROXYCHLOROQUINE SULFATE 200 MG PO TABS: 200.0000 mg | ORAL_TABLET | Freq: Every day | ORAL | 1 refills | Status: DC

## 2017-11-17 NOTE — Telephone Encounter (Signed)
Last Visit: 09/08/17 Next Visit: 02/09/18 Labs: 09/08/17 WNL PLQ eye exam: 02/03/2017 Neg  Okay to refill per Dr. Corliss Skainseveshwar  Patient advised

## 2018-01-26 NOTE — Progress Notes (Signed)
Office Visit Note  Patient: Suzanne [name]             Date of Birth: [birth year]           MRN: [id number]             PCP: Marylen PontoHolt, Lynley S, MD Referring: Marylen PontoHolt, Lynley S, MD Visit Date: 02/09/2018 Occupation: @GUAROCC @  Subjective:  Pain in both hands   History of Present Illness: Suzanne [name] is a 57 y.o. female with history of autoimmune disease and osteoarthritis. She is taking Plaquenil 200 mg 1 tablet po daily.  She denies any recent flares.  She denies any worsening fatigue or low grade fevers.  She denies any shortness of breath or palpitations.  She denies any recent hair loss. She denies any recent rashes but continues to have photosensitivity and facial redness.  She has recurrent painful oral ulcerations.  She swishes with peroxide when she has ulcerations. She denies any nose sores. She reports eye dryness but denies mouth dryness.  She uses eye drops. She has been having increased pain in both hands, worse in the right hand.  She has noticed mild swelling and stiffness.  She denies any other joint pain or joint swelling at this time.     Activities of Daily Living:  Patient reports morning stiffness for 30 minutes.   Patient Denies nocturnal pain.  Difficulty dressing/grooming: Denies Difficulty climbing stairs: Denies Difficulty getting out of chair: Denies Difficulty using hands for taps, buttons, cutlery, and/or writing: Denies  Review of Systems  Constitutional: Positive for fatigue.  HENT: Positive for mouth sores. Negative for trouble swallowing, trouble swallowing, mouth dryness and nose dryness.   Eyes: Positive for dryness. Negative for pain and visual disturbance.  Respiratory: Negative for cough, hemoptysis, shortness of breath and difficulty breathing.   Cardiovascular: Negative for chest pain, palpitations, hypertension and swelling in legs/feet.  Gastrointestinal: Positive for constipation. Negative for abdominal pain, blood in stool, diarrhea and  nausea.  Endocrine: Negative for increased urination.  Genitourinary: Negative for painful urination, nocturia and pelvic pain.  Musculoskeletal: Positive for arthralgias, joint pain, joint swelling and morning stiffness. Negative for myalgias, muscle weakness, muscle tenderness and myalgias.  Skin: Negative for color change, pallor, rash, hair loss, nodules/bumps, skin tightness, ulcers and sensitivity to sunlight.  Allergic/Immunologic: Negative for susceptible to infections.  Neurological: Negative for dizziness, light-headedness, numbness, memory loss and weakness.  Hematological: Negative for bruising/bleeding tendency and swollen glands.  Psychiatric/Behavioral: Negative for depressed mood, confusion and sleep disturbance. The patient is not nervous/anxious.     PMFS History:  Patient Active Problem List   Diagnosis Date Noted  . Autoimmune disease (HCC) 08/26/2016  . High risk medication use 08/26/2016  . Psoriasis 08/26/2016  . Primary osteoarthritis of both hands 08/26/2016  . History of Sjogren's disease 08/26/2016  . Pain, joint, knee, left 08/26/2016  . History of hypertension 08/26/2016  . H/O rosacea 08/26/2016    Past Medical History:  Diagnosis Date  . Hypertension     Family History  Problem Relation Age of Onset  . Hypertension Mother   . Stroke Mother   . Hypercholesterolemia Mother   . Cancer Father   . Clotting disorder Daughter    History reviewed. No pertinent surgical history. Social History   Social History Narrative  . Not on file    Objective: Vital Signs: BP (!) 148/94 (BP Location: Left Arm, Patient Position: Sitting, Cuff Size: Normal)   Pulse 62   Resp  15   Ht 5\' 7"  (1.702 m)   Wt 172 lb 9.6 oz (78.3 kg)   BMI 27.03 kg/m    Physical Exam  Constitutional: She is oriented to person, place, and time. She appears well-developed and well-nourished.  HENT:  Head: Normocephalic and atraumatic.  Eyes: Conjunctivae and EOM are normal.    Neck: Normal range of motion.  Cardiovascular: Normal rate, regular rhythm, normal heart sounds and intact distal pulses.  Pulmonary/Chest: Effort normal and breath sounds normal.  Abdominal: Soft. Bowel sounds are normal.  Lymphadenopathy:    She has no cervical adenopathy.  Neurological: She is alert and oriented to person, place, and time.  Skin: Skin is warm and dry. Capillary refill takes less than 2 seconds.  Psychiatric: She has a normal mood and affect. Her behavior is normal.  Nursing note and vitals reviewed.    Musculoskeletal Exam: C-spine limited range of motion with lateral rotation.  Thoracic and lumbar spine good range of motion.  No midline spinal tenderness.  No SI joint tenderness.  Shoulder joints, elbow joints, wrist joints, MCPs, PIPs, DIPs good range of motion with no synovitis.  She has complete fist formation bilaterally.  She has tenderness of right second and third PIP joints and left fifth PIP joint.  Hip joints, knee joints and ankle joints, MCPs and PIPs, DIPs good range of motion no synovitis.  No warmth or effusion of bilateral knee joints.  No tenderness of trochanter bursa bilaterally.  No tenderness or swelling of ankle joints.  No Achilles tendinitis or plantar fasciitis.  CDAI Exam: CDAI Score: Not documented Patient Global Assessment: Not documented; Provider Global Assessment: Not documented Swollen: 0 ; Tender: 4  Joint Exam      Right  Left  PIP 2   Tender     PIP 3   Tender     PIP 5      Tender  DIP 2   Tender        Investigation: No additional findings.  Imaging: No results found.  Recent Labs: Lab Results  Component Value Date   WBC 4.2 09/08/2017   HGB 12.9 09/08/2017   PLT 259 09/08/2017   NA 140 09/08/2017   K 3.9 09/08/2017   CL 103 09/08/2017   CO2 28 09/08/2017   GLUCOSE 91 09/08/2017   BUN 19 09/08/2017   CREATININE 0.94 09/08/2017   BILITOT 0.4 09/08/2017   ALKPHOS 59 08/05/2016   AST 17 09/08/2017   ALT 21  09/08/2017   PROT 8.0 09/08/2017   ALBUMIN 4.4 08/05/2016   CALCIUM 9.8 09/08/2017   GFRAA 78 09/08/2017    Speciality Comments: No specialty comments available.  Procedures:  No procedures performed Allergies: Patient has no known allergies.   Assessment / Plan:     Visit Diagnoses: Autoimmune disease (HCC) - +ANA, +RF, +RO, +LA, +ACE. h/o sicca symptoms, malar rash, fatigue, neutropenia: She has no synovitis on exam today.  She has tenderness of her right second and third PIP and left fifth PIP joint.  She has a nodule on the right third PIP joint.  We discussed referring her for a biopsy but she declined at this time.  We will check CCP and RF today.  She has no other joint pain or joint swelling at this time.  She has not had any recent worsening fatigue or low-grade fevers.  She continues to have facial erythema which seems to be related to rosacea.  She has photosensitivity and tries to  avoid the sun as much as she can.  She denies any recent hair loss.  She continues to have recurrent oral ulcerations but no nasal ulcerations.  She has no parotid swelling exam.  She continues to have eye dryness and uses over-the-counter eyedrops.  She has no cervical lymphadenopathy on exam.  She will continue on the current treatment regimen of Plaquenil 200 mg 1 tablet daily.  A refill was provided to the patient today.  We will check autoimmune labs today.- Plan: Urinalysis, Routine w reflex microscopic, Anti-DNA antibody, double-stranded, C3 and C4, Sedimentation rate, CBC with Differential/Platelet, COMPLETE METABOLIC PANEL WITH GFR, Cyclic citrul peptide antibody, IgG, Rheumatoid factor  High risk medication use - PLQ 200 mg by mouth daily.  CBC and CMP were drawn today to monitor for drug toxicity.- Plan: CBC with Differential/Platelet, COMPLETE METABOLIC PANEL WITH GFR  Coccygodynia: Resolved  Primary osteoarthritis of both hands: She has PIP and DIP synovial thickening consistent with  osteoarthritis of bilateral hands.  She has PIP tenderness as described above.  Pain in both hands -She is been having increased pain in both hands.  She has tenderness of right second and third PIP and fifth PIP joints.  CCP and RF will be checked today.  Plan: Cyclic citrul peptide antibody, IgG, Rheumatoid factor   Other medical conditions are listed as follows:  Psoriasis  History of vitamin D deficiency  H/O rosacea  History of hypertension   Orders: Orders Placed This Encounter  Procedures  . Urinalysis, Routine w reflex microscopic  . Anti-DNA antibody, double-stranded  . C3 and C4  . Sedimentation rate  . CBC with Differential/Platelet  . COMPLETE METABOLIC PANEL WITH GFR  . Cyclic citrul peptide antibody, IgG  . Rheumatoid factor   Meds ordered this encounter  Medications  . hydroxychloroquine (PLAQUENIL) 200 MG tablet    Sig: Take 1 tablet (200 mg total) by mouth daily.    Dispense:  90 tablet    Refill:  0    Face-to-face time spent with patient was 30 minutes. Greater than 50% of time was spent in counseling and coordination of care.  Follow-Up Instructions: Return in about 5 months (around 07/12/2018) for Autoimmune Disease, Osteoarthritis.   Gearldine Bienenstock, PA-C  Note - This record has been created using Dragon software.  Chart creation errors have been sought, but may not always  have been located. Such creation errors do not reflect on  the standard of medical care.

## 2018-02-09 ENCOUNTER — Ambulatory Visit (INDEPENDENT_AMBULATORY_CARE_PROVIDER_SITE_OTHER): Payer: Managed Care, Other (non HMO) | Admitting: Physician Assistant

## 2018-02-09 ENCOUNTER — Encounter: Payer: Self-pay | Admitting: Physician Assistant

## 2018-02-09 VITALS — BP 148/94 | HR 62 | Resp 15 | Ht 67.0 in | Wt 172.6 lb

## 2018-02-09 DIAGNOSIS — M19041 Primary osteoarthritis, right hand: Secondary | ICD-10-CM | POA: Diagnosis not present

## 2018-02-09 DIAGNOSIS — M359 Systemic involvement of connective tissue, unspecified: Secondary | ICD-10-CM

## 2018-02-09 DIAGNOSIS — Z79899 Other long term (current) drug therapy: Secondary | ICD-10-CM | POA: Diagnosis not present

## 2018-02-09 DIAGNOSIS — M19042 Primary osteoarthritis, left hand: Secondary | ICD-10-CM

## 2018-02-09 DIAGNOSIS — M79641 Pain in right hand: Secondary | ICD-10-CM

## 2018-02-09 DIAGNOSIS — Z8639 Personal history of other endocrine, nutritional and metabolic disease: Secondary | ICD-10-CM

## 2018-02-09 DIAGNOSIS — M79642 Pain in left hand: Secondary | ICD-10-CM

## 2018-02-09 DIAGNOSIS — D8989 Other specified disorders involving the immune mechanism, not elsewhere classified: Secondary | ICD-10-CM | POA: Diagnosis not present

## 2018-02-09 DIAGNOSIS — Z8679 Personal history of other diseases of the circulatory system: Secondary | ICD-10-CM

## 2018-02-09 DIAGNOSIS — M533 Sacrococcygeal disorders, not elsewhere classified: Secondary | ICD-10-CM

## 2018-02-09 DIAGNOSIS — L409 Psoriasis, unspecified: Secondary | ICD-10-CM

## 2018-02-09 DIAGNOSIS — Z872 Personal history of diseases of the skin and subcutaneous tissue: Secondary | ICD-10-CM

## 2018-02-09 MED ORDER — HYDROXYCHLOROQUINE SULFATE 200 MG PO TABS: 200.0000 mg | ORAL_TABLET | Freq: Every day | ORAL | 0 refills | Status: DC

## 2018-02-09 NOTE — Patient Instructions (Signed)
*  Your Plaquenil eye exam is due in October.  Standing Labs We placed an order today for your standing lab work.    Please come back and get your standing labs in 5 months.  We have open lab Monday through Friday from 8:30-11:30 AM and 1:30-4:00 PM  at the office of Dr. Pollyann SavoyShaili Deveshwar.   You may experience shorter wait times on Monday and Friday afternoons. The office is located at [address], [address], [place], [place] [address] No appointment is necessary.   Labs are drawn by [facility].  You may receive a bill from [facility] for your lab work. If you have any questions regarding directions or hours of operation,  please call [phone].

## 2018-02-10 LAB — COMPLETE METABOLIC PANEL WITH GFR
AG Ratio: 1.4 (calc) (ref 1.0–2.5)
ALT: 18 U/L (ref 6–29)
AST: 16 U/L (ref 10–35)
Albumin: 4.6 g/dL (ref 3.6–5.1)
Alkaline phosphatase (APISO): 60 U/L (ref 33–130)
BUN: 15 mg/dL (ref 7–25)
CALCIUM: 9.6 mg/dL (ref 8.6–10.4)
CO2: 32 mmol/L (ref 20–32)
CREATININE: 1.02 mg/dL (ref 0.50–1.05)
Chloride: 102 mmol/L (ref 98–110)
GFR, EST NON AFRICAN AMERICAN: 60 mL/min/{1.73_m2} (ref >=60)
GFR, Est African American: 70 mL/min/{1.73_m2} (ref >=60)
GLOBULIN: 3.3 g/dL (ref 1.9–3.7)
GLUCOSE: 97 mg/dL (ref 65–99)
Potassium: 3.6 mmol/L (ref 3.5–5.3)
SODIUM: 140 mmol/L (ref 135–146)
TOTAL PROTEIN: 7.9 g/dL (ref 6.1–8.1)
Total Bilirubin: 0.6 mg/dL (ref 0.2–1.2)

## 2018-02-10 LAB — CBC WITH DIFFERENTIAL/PLATELET
BASOS ABS: 29 {cells}/uL (ref 0–200)
BASOS PCT: 0.8 %
EOS ABS: 50 {cells}/uL (ref 15–500)
Eosinophils Relative: 1.4 %
HEMATOCRIT: 37.9 % (ref 35.0–45.0)
HEMOGLOBIN: 12.9 g/dL (ref 11.7–15.5)
LYMPHS ABS: 950 {cells}/uL (ref 850–3900)
MCH: 29.8 pg (ref 27.0–33.0)
MCHC: 34 g/dL (ref 32.0–36.0)
MCV: 87.5 fL (ref 80.0–100.0)
MONOS PCT: 8.5 %
MPV: 10 fL (ref 7.5–12.5)
NEUTROS ABS: 2264 {cells}/uL (ref 1500–7800)
Neutrophils Relative %: 62.9 %
Platelets: 219 10*3/uL (ref 140–400)
RBC: 4.33 10*6/uL (ref 3.80–5.10)
RDW: 13.1 % (ref 11.0–15.0)
Total Lymphocyte: 26.4 %
WBC: 3.6 10*3/uL — ABNORMAL LOW (ref 3.8–10.8)
WBCMIX: 306 {cells}/uL (ref 200–950)

## 2018-02-10 LAB — RHEUMATOID FACTOR: Rhuematoid fact SerPl-aCnc: <14 IU/mL (ref <14)

## 2018-02-10 LAB — C3 AND C4
C3 Complement: 127 mg/dL (ref 83–193)
C4 Complement: 27 mg/dL (ref 15–57)

## 2018-02-10 LAB — URINALYSIS, ROUTINE W REFLEX MICROSCOPIC
Bilirubin Urine: NEGATIVE
Glucose, UA: NEGATIVE
Hgb urine dipstick: NEGATIVE
Ketones, ur: NEGATIVE
Leukocytes, UA: NEGATIVE
Nitrite: NEGATIVE
Protein, ur: NEGATIVE
Specific Gravity, Urine: 1.012 (ref 1.001–1.03)
pH: 6 (ref 5.0–8.0)

## 2018-02-10 LAB — ANTI-DNA ANTIBODY, DOUBLE-STRANDED: DS DNA AB: 1 [IU]/mL

## 2018-02-10 LAB — CYCLIC CITRUL PEPTIDE ANTIBODY, IGG: Cyclic Citrullin Peptide Ab: <16 UNITS

## 2018-02-10 LAB — SEDIMENTATION RATE: Sed Rate: 11 mm/h (ref 0–30)

## 2018-02-10 NOTE — Progress Notes (Signed)
WBC count borderline low.  Please advise patient to recheck CBC in 1 month.  All other labs are WNL.

## 2018-02-11 ENCOUNTER — Telehealth: Payer: Self-pay | Admitting: *Deleted

## 2018-02-11 DIAGNOSIS — Z79899 Other long term (current) drug therapy: Secondary | ICD-10-CM

## 2018-02-11 NOTE — Telephone Encounter (Signed)
-----   Message from Gearldine Bienenstockaylor M Dale, PA-C sent at 02/10/2018  5:10 PM EDT ----- WBC count borderline low.  Please advise patient to recheck CBC in 1 month.  All other labs are WNL.

## 2018-06-29 NOTE — Progress Notes (Signed)
Office Visit Note  Patient: Suzanne [name]             Date of Birth: 05/19/1961           MRN: [id number]             PCP: Marylen PontoHolt, Lynley S, MD Referring: Marylen PontoHolt, Lynley S, MD Visit Date: 07/13/2018 Occupation: @GUAROCC @  Subjective:  Fatigue    History of Present Illness: Suzanne [name] is a 58 y.o. female with history of autoimmune disease and osteoarthritis.  She is taking PLQ 200 mg 1 tablet by mouth daily.  She continues to have eye dryness and nose dryness.  She denies any mouth dryness.  She continues to have facial rashes and occasional rashes on torso.  She denies any photosensitivity.  She denies any sores in her mouth or nose.  She denies any recent hair loss.  She denies any palpitations or shortness of breath.  She has occasional lower back as well as bilateral shoulder pain.  She denies any joint swelling at this time.  She continues to have chronic fatigue. She will be calling her ophthalmologist today to schedule a Plaquenil eye exam.   Activities of Daily Living:  Patient reports morning stiffness for 30 minutes.   Patient Reports nocturnal pain.  Difficulty dressing/grooming: Denies Difficulty climbing stairs: Denies Difficulty getting out of chair: Denies Difficulty using hands for taps, buttons, cutlery, and/or writing: Denies  Review of Systems  Constitutional: Positive for fatigue.  HENT: Positive for nose dryness. Negative for mouth sores and mouth dryness.   Eyes: Positive for dryness. Negative for pain, itching and visual disturbance.  Respiratory: Negative for cough, hemoptysis, shortness of breath, wheezing and difficulty breathing.   Cardiovascular: Negative for chest pain, palpitations, hypertension, irregular heartbeat and swelling in legs/feet.  Gastrointestinal: Positive for constipation. Negative for abdominal pain, blood in stool and diarrhea.  Endocrine: Negative for increased urination.  Genitourinary: Negative for painful urination, nocturia  and pelvic pain.  Musculoskeletal: Positive for arthralgias, joint pain and morning stiffness. Negative for joint swelling, myalgias, muscle weakness, muscle tenderness and myalgias.  Skin: Positive for rash and redness. Negative for color change, pallor, hair loss, nodules/bumps, skin tightness, ulcers and sensitivity to sunlight.  Allergic/Immunologic: Negative for susceptible to infections.  Neurological: Negative for dizziness, light-headedness, numbness, headaches and memory loss.  Hematological: Negative for bruising/bleeding tendency and swollen glands.  Psychiatric/Behavioral: Positive for depressed mood. Negative for confusion and sleep disturbance. The patient is not nervous/anxious.     PMFS History:  Patient Active Problem List   Diagnosis Date Noted  . Autoimmune disease (HCC) 08/26/2016  . High risk medication use 08/26/2016  . Psoriasis 08/26/2016  . Primary osteoarthritis of both hands 08/26/2016  . History of Sjogren's disease (HCC) 08/26/2016  . Pain, joint, knee, left 08/26/2016  . History of hypertension 08/26/2016  . H/O rosacea 08/26/2016    Past Medical History:  Diagnosis Date  . Hypertension     Family History  Problem Relation Age of Onset  . Hypertension Mother   . Stroke Mother   . Hypercholesterolemia Mother   . Cancer Father   . Clotting disorder Daughter    History reviewed. No pertinent surgical history. Social History   Social History Narrative  . Not on file   Immunization History  Administered Date(s) Administered  . Influenza,inj,Quad PF,6+ Mos 03/23/2017     Objective: Vital Signs: BP (!) 144/74 (BP Location: Left Arm, Patient Position: Sitting, Cuff Size: Normal)  Pulse 83   Resp 12   Ht 5\' 7"  (1.702 m)   Wt 168 lb 3.2 oz (76.3 kg)   BMI 26.34 kg/m    Physical Exam Vitals signs and nursing note reviewed.  Constitutional:      Appearance: She is well-developed.  HENT:     Head: Normocephalic and atraumatic.     Comments:  No parotid swelling or tenderness.     Nose:     Comments: No nasal ulcerations     Mouth/Throat:     Comments: No oral ulcerations Eyes:     Conjunctiva/sclera: Conjunctivae normal.  Neck:     Musculoskeletal: Normal range of motion.  Cardiovascular:     Rate and Rhythm: Normal rate and regular rhythm.     Heart sounds: Normal heart sounds.  Pulmonary:     Effort: Pulmonary effort is normal.     Breath sounds: Normal breath sounds.  Abdominal:     General: Bowel sounds are normal.     Palpations: Abdomen is soft.  Lymphadenopathy:     Cervical: No cervical adenopathy.  Skin:    General: Skin is warm and dry.     Capillary Refill: Capillary refill takes less than 2 seconds.  Neurological:     Mental Status: She is alert and oriented to person, place, and time.  Psychiatric:        Behavior: Behavior normal.      Musculoskeletal Exam: C-spine, thoracic spine, and lumbar spine good ROM. No midline spinal tenderness.  Shoulder joints, elbow joints, wrist joints, MCPs, PIPs, and DIPs good ROM with no synovitis.  PIP and DIP synovial thickening consistent with osteoarthritis. Hip joints, knee joints, ankle joints, MTPs, PIPs, and DIPs good ROM with no synovitis.  No warmth or effusion of knee joints.  No tenderness or swelling of ankle joints.   CDAI Exam: CDAI Score: Not documented Patient Global Assessment: Not documented; Provider Global Assessment: Not documented Swollen: Not documented; Tender: Not documented Joint Exam   Not documented   There is currently no information documented on the homunculus. Go to the Rheumatology activity and complete the homunculus joint exam.  Investigation: No additional findings.  Imaging: No results found.  Recent Labs: Lab Results  Component Value Date   WBC 3.6 (L) 02/09/2018   HGB 12.9 02/09/2018   PLT 219 02/09/2018   NA 140 02/09/2018   K 3.6 02/09/2018   CL 102 02/09/2018   CO2 32 02/09/2018   GLUCOSE 97 02/09/2018    BUN 15 02/09/2018   CREATININE 1.02 02/09/2018   BILITOT 0.6 02/09/2018   ALKPHOS 59 08/05/2016   AST 16 02/09/2018   ALT 18 02/09/2018   PROT 7.9 02/09/2018   ALBUMIN 4.4 08/05/2016   CALCIUM 9.6 02/09/2018   GFRAA 70 02/09/2018    Speciality Comments: PLaquenil eye exam [facility] on 02/03/17 [facility]  Procedures:  No procedures performed Allergies: Patient has no known allergies.     Assessment / Plan:     Visit Diagnoses: Autoimmune disease (HCC) - +ANA, +RF, +RO, +LA, +ACE. h/o sicca symptoms, malar rash, fatigue, neutropenia: She has not had any recent signs or symptoms of a flare.  She has clinically been doing well and stable on PLQ 200 mg 1 tablet po daily.  She continues to have chronic fatigue but cervical lymphadenopathy was palpated and she has not had any recent fevers.  She continues to have chronic eye and nasal dryness but no mouth dryness.  No  parotid swelling was noted on exam.  She has no oral or nasal ulcerations on exam.  She has not had any symptoms of Raynaud's recently.  She has no digital ulcerations or signs of gangrene.  She has occasional bilateral shoulder pain and lower back pain. She has no synovitis on exam. She will continue on PLQ 200 mg 1 tablet daily.  A refill of PLQ was provided to the patient.  We will check autoimmune labs today.  She will follow up in 5 months.  She was advised to notify us if she develops any new or worsening symptoms.  - Plan: Urinalysis, Routine w reflex microscopic, Anti-DNA antibody, double-stranded, C3 and C4, Sedimentation rate  High risk medication use - PLQ 200 mg by mouth daily.  CBC and CMP will be drawn today to monitor for drug toxicity. She was advised to schedule a PLQ eye exam ASAP.  She was given a PLQ eye exam for to take with her to her next appointment.  - Plan: CBC with Differential/Platelet, COMPLETE METABOLIC PANEL WITH GFR  Primary osteoarthritis of both hands: She has PIP and DIP synovial  thickening consistent with osteoarthritis of bilateral hands.  She has no synovitis.  She has complete fist formation bilaterally.  Joint protection and muscle strengthening were discussed.  Psoriasis: She has no psoriasis at this time.   Other medical conditions are listed as follows:   Coccygodynia  History of vitamin D deficiency  H/O rosacea  History of hypertension   Orders: Orders Placed This Encounter  Procedures  . CBC with Differential/Platelet  . COMPLETE METABOLIC PANEL WITH GFR  . Urinalysis, Routine w reflex microscopic  . Anti-DNA antibody, double-stranded  . C3 and C4  . Sedimentation rate   Meds ordered this encounter  Medications  . hydroxychloroquine (PLAQUENIL) 200 MG tablet    Sig: Take 1 tablet (200 mg total) by mouth daily.    Dispense:  90 tablet    Refill:  0      Follow-Up Instructions: Return in about 5 months (around 12/12/2018) for Autoimmune Disease.   Gearldine Bienenstock, PA-C  Note - This record has been created using Dragon software.  Chart creation errors have been sought, but may not always  have been located. Such creation errors do not reflect on  the standard of medical care.

## 2018-07-13 ENCOUNTER — Ambulatory Visit (INDEPENDENT_AMBULATORY_CARE_PROVIDER_SITE_OTHER): Payer: Managed Care, Other (non HMO) | Admitting: Physician Assistant

## 2018-07-13 ENCOUNTER — Encounter: Payer: Self-pay | Admitting: Physician Assistant

## 2018-07-13 VITALS — BP 144/74 | HR 83 | Resp 12 | Ht 67.0 in | Wt 168.2 lb

## 2018-07-13 DIAGNOSIS — M19041 Primary osteoarthritis, right hand: Secondary | ICD-10-CM

## 2018-07-13 DIAGNOSIS — L409 Psoriasis, unspecified: Secondary | ICD-10-CM

## 2018-07-13 DIAGNOSIS — Z8679 Personal history of other diseases of the circulatory system: Secondary | ICD-10-CM

## 2018-07-13 DIAGNOSIS — M359 Systemic involvement of connective tissue, unspecified: Secondary | ICD-10-CM | POA: Diagnosis not present

## 2018-07-13 DIAGNOSIS — Z872 Personal history of diseases of the skin and subcutaneous tissue: Secondary | ICD-10-CM

## 2018-07-13 DIAGNOSIS — Z8639 Personal history of other endocrine, nutritional and metabolic disease: Secondary | ICD-10-CM

## 2018-07-13 DIAGNOSIS — M533 Sacrococcygeal disorders, not elsewhere classified: Secondary | ICD-10-CM

## 2018-07-13 DIAGNOSIS — Z79899 Other long term (current) drug therapy: Secondary | ICD-10-CM | POA: Diagnosis not present

## 2018-07-13 DIAGNOSIS — M19042 Primary osteoarthritis, left hand: Secondary | ICD-10-CM

## 2018-07-13 MED ORDER — HYDROXYCHLOROQUINE SULFATE 200 MG PO TABS: 200.0000 mg | ORAL_TABLET | Freq: Every day | ORAL | 0 refills | Status: DC

## 2018-07-14 LAB — COMPLETE METABOLIC PANEL WITH GFR
AG RATIO: 1.8 (calc) (ref 1.0–2.5)
ALBUMIN MSPROF: 4.8 g/dL (ref 3.6–5.1)
ALKALINE PHOSPHATASE (APISO): 62 U/L (ref 33–130)
ALT: 17 U/L (ref 6–29)
AST: 15 U/L (ref 10–35)
BUN: 21 mg/dL (ref 7–25)
CO2: 31 mmol/L (ref 20–32)
CREATININE: 0.92 mg/dL (ref 0.50–1.05)
Calcium: 9.8 mg/dL (ref 8.6–10.4)
Chloride: 103 mmol/L (ref 98–110)
GFR, Est African American: 79 mL/min/{1.73_m2} (ref >=60)
GFR, Est Non African American: 68 mL/min/{1.73_m2} (ref >=60)
GLOBULIN: 2.7 g/dL (ref 1.9–3.7)
GLUCOSE: 92 mg/dL (ref 65–99)
POTASSIUM: 3.9 mmol/L (ref 3.5–5.3)
SODIUM: 140 mmol/L (ref 135–146)
Total Bilirubin: 0.6 mg/dL (ref 0.2–1.2)
Total Protein: 7.5 g/dL (ref 6.1–8.1)

## 2018-07-14 LAB — CBC WITH DIFFERENTIAL/PLATELET
Absolute Monocytes: 371 cells/uL (ref 200–950)
BASOS ABS: 28 {cells}/uL (ref 0–200)
Basophils Relative: 0.6 %
EOS ABS: 42 {cells}/uL (ref 15–500)
Eosinophils Relative: 0.9 %
HCT: 39.4 % (ref 35.0–45.0)
HEMOGLOBIN: 13.5 g/dL (ref 11.7–15.5)
Lymphs Abs: 663 cells/uL — ABNORMAL LOW (ref 850–3900)
MCH: 30.4 pg (ref 27.0–33.0)
MCHC: 34.3 g/dL (ref 32.0–36.0)
MCV: 88.7 fL (ref 80.0–100.0)
MONOS PCT: 7.9 %
MPV: 10.3 fL (ref 7.5–12.5)
NEUTROS ABS: 3596 {cells}/uL (ref 1500–7800)
NEUTROS PCT: 76.5 %
Platelets: 235 10*3/uL (ref 140–400)
RBC: 4.44 10*6/uL (ref 3.80–5.10)
RDW: 12.7 % (ref 11.0–15.0)
Total Lymphocyte: 14.1 %
WBC: 4.7 10*3/uL (ref 3.8–10.8)

## 2018-07-14 LAB — URINALYSIS, ROUTINE W REFLEX MICROSCOPIC
Bilirubin Urine: NEGATIVE
Glucose, UA: NEGATIVE
HGB URINE DIPSTICK: NEGATIVE
Ketones, ur: NEGATIVE
Leukocytes, UA: NEGATIVE
Nitrite: NEGATIVE
Protein, ur: NEGATIVE
Specific Gravity, Urine: 1.028 (ref 1.001–1.03)
pH: 5.5 (ref 5.0–8.0)

## 2018-07-14 LAB — ANTI-DNA ANTIBODY, DOUBLE-STRANDED: ds DNA Ab: 1 IU/mL

## 2018-07-14 LAB — SEDIMENTATION RATE: Sed Rate: 9 mm/h (ref 0–30)

## 2018-07-14 LAB — C3 AND C4
C3 Complement: 114 mg/dL (ref 83–193)
C4 Complement: 26 mg/dL (ref 15–57)

## 2018-07-14 NOTE — Progress Notes (Signed)
WBC count has trended up. CMP WNL. UA WNL. Sed rate WNL. Complements WNL.

## 2018-07-14 NOTE — Progress Notes (Signed)
DsDNA is <1.

## 2018-09-04 ENCOUNTER — Other Ambulatory Visit: Payer: Self-pay

## 2018-09-04 MED ORDER — HYDROXYCHLOROQUINE SULFATE 200 MG PO TABS: 200.0000 mg | ORAL_TABLET | Freq: Every day | ORAL | 0 refills | Status: DC

## 2018-09-04 NOTE — Progress Notes (Signed)
Okay to refill per Dr. Corliss Skains. Patient has been notified.

## 2018-11-23 NOTE — Progress Notes (Signed)
Office Visit Note  Patient: Suzanne [name]             Date of Birth: 1961-03-05           MRN: [id number]             PCP: Ronita Hipps, MD Referring: Ronita Hipps, MD Visit Date: 12/07/2018 Occupation: @GUAROCC @  Subjective:  Medication monitoring    History of Present Illness: Suzanne [name] is a 58 y.o. female with history of autoimmune disease and osteoarthritis.  She is taking Plaquenil 200 mg 1 tablet by mouth daily.  She has not had any recent signs or symptoms of a flare.  She has not had any recent infections.  She has not missed any doses of Plaquenil recently.  She denies any recent rashes, photosensitivity, symptoms of Raynaud's.  She denies any sores in her mouth or nose.  She continues have eye dryness but denies any mouth dryness at this time.  She denies any shortness of breath or palpitations recently.  She denies any swollen lymph nodes or fevers.  Her level of fatigue has been stable.  She is some difficulty sleeping at night.  She denies any joint pain or joint swelling at this time.  She has a morning stiffness lasting for 20 to 30 minutes.  She occasionally will have bilateral knee joint pain but does not have any pain or swelling at this time.   Activities of Daily Living:  Patient reports morning stiffness for 20-30 minutes.   Patient Denies nocturnal pain.  Difficulty dressing/grooming: Denies Difficulty climbing stairs: Denies Difficulty getting out of chair: Denies Difficulty using hands for taps, buttons, cutlery, and/or writing: Denies  Review of Systems  Constitutional: Positive for fatigue.  HENT: Negative for mouth sores, mouth dryness and nose dryness.   Eyes: Positive for dryness. Negative for pain, itching and visual disturbance.  Respiratory: Negative for cough, hemoptysis, shortness of breath, wheezing and difficulty breathing.   Cardiovascular: Negative for chest pain, palpitations, hypertension and swelling in legs/feet.   Gastrointestinal: Negative for abdominal pain, blood in stool, constipation and diarrhea.  Endocrine: Negative for increased urination.  Genitourinary: Negative for painful urination and pelvic pain.  Musculoskeletal: Positive for arthralgias, joint pain and morning stiffness. Negative for joint swelling, myalgias, muscle weakness, muscle tenderness and myalgias.  Skin: Negative for color change, pallor, rash, hair loss, nodules/bumps, redness, skin tightness, ulcers and sensitivity to sunlight.  Allergic/Immunologic: Negative for susceptible to infections.  Neurological: Negative for dizziness, light-headedness, numbness, headaches, memory loss and weakness.  Hematological: Negative for bruising/bleeding tendency and swollen glands.  Psychiatric/Behavioral: Negative for depressed mood, confusion and sleep disturbance. The patient is not nervous/anxious.     PMFS History:  Patient Active Problem List   Diagnosis Date Noted  . Autoimmune disease ([place]) 08/26/2016  . High risk medication use 08/26/2016  . Psoriasis 08/26/2016  . Primary osteoarthritis of both hands 08/26/2016  . History of Sjogren's disease ([place]) 08/26/2016  . Pain, joint, knee, left 08/26/2016  . History of hypertension 08/26/2016  . H/O rosacea 08/26/2016    Past Medical History:  Diagnosis Date  . Hypertension     Family History  Problem Relation Age of Onset  . Hypertension Mother   . Stroke Mother   . Hypercholesterolemia Mother   . Cancer Father   . Clotting disorder Daughter    History reviewed. No pertinent surgical history. Social History   Social History Narrative  . Not on file  Immunization History  Administered Date(s) Administered  . Influenza,inj,Quad PF,6+ Mos 03/23/2017     Objective: Vital Signs: BP (!) 146/84 (BP Location: Left Arm, Patient Position: Sitting, Cuff Size: Normal)   Pulse 71   Resp 13   Ht 5' 7.5" (1.715 m)   Wt 164 lb 3.2 oz (74.5 kg)   BMI 25.34 kg/m    Physical  Exam Vitals signs and nursing note reviewed.  Constitutional:      Appearance: She is well-developed.  HENT:     Head: Normocephalic and atraumatic.  Eyes:     Conjunctiva/sclera: Conjunctivae normal.  Neck:     Musculoskeletal: Normal range of motion.  Cardiovascular:     Rate and Rhythm: Normal rate and regular rhythm.     Heart sounds: Normal heart sounds.  Pulmonary:     Effort: Pulmonary effort is normal.     Breath sounds: Normal breath sounds.  Abdominal:     General: Bowel sounds are normal.     Palpations: Abdomen is soft.  Lymphadenopathy:     Cervical: No cervical adenopathy.  Skin:    General: Skin is warm and dry.     Capillary Refill: Capillary refill takes less than 2 seconds.  Neurological:     Mental Status: She is alert and oriented to person, place, and time.  Psychiatric:        Behavior: Behavior normal.      Musculoskeletal Exam: C-spine, thoracic spine, and lumbar spine good ROM.  No midline spinal tenderness.  No SI joint tenderness.  Shoulder joints, elbow joints, wrist joints, MCPs, PIPs, and DIPs good ROM with no synovitis.  Hip joints, knee joints, ankle joints, MTPs, PIPs, and DIPs good ROM with no synovitis.  No warmth or effusion of knee joints.  No tenderness or swelling of ankle joints.  No achilles tendonitis or plantar fasciitis.  No tenderness over trochanteric bursa bilaterally.    CDAI Exam: CDAI Score: - Patient Global: -; Provider Global: - Swollen: -; Tender: - Joint Exam   No joint exam has been documented for this visit   There is currently no information documented on the homunculus. Go to the Rheumatology activity and complete the homunculus joint exam.  Investigation: No additional findings.  Imaging: No results found.  Recent Labs: Lab Results  Component Value Date   WBC 4.7 07/13/2018   HGB 13.5 07/13/2018   PLT 235 07/13/2018   NA 140 07/13/2018   K 3.9 07/13/2018   CL 103 07/13/2018   CO2 31 07/13/2018    GLUCOSE 92 07/13/2018   BUN 21 07/13/2018   CREATININE 0.92 07/13/2018   BILITOT 0.6 07/13/2018   ALKPHOS 59 08/05/2016   AST 15 07/13/2018   ALT 17 07/13/2018   PROT 7.5 07/13/2018   ALBUMIN 4.4 08/05/2016   CALCIUM 9.8 07/13/2018   GFRAA 79 07/13/2018    Speciality Comments: PLaquenil eye exam normal on 02/03/17 per [facility]   Procedures:  No procedures performed Allergies: Patient has no known allergies.   Assessment / Plan:     Visit Diagnoses: Autoimmune disease (HCC) - +ANA, +RF, +RO, +LA, +ACE. h/o sicca symptoms, malar rash, fatigue, neutropenia: She has not had any signs or symptoms of a flare recently.  She is clinically doing well on Plaquenil 200 mg 1 tablet by mouth daily.  Sed rate, double-stranded DNA, and complements within normal limits on 07/13/2018.  She has no synovitis on exam.  She has no joint pain or joint swelling  at this time.  She has morning stiffness lasting about 20-30 minutes.  She has not had any recent rashes, photosensitivity, or symptoms of Raynaud's.  She has chronic eye dryness but no mouth dryness.  No oral or nasal ulcerations.  She has not had palpitations or shortness of breath.  She has not had any swollen lymph nodes or fevers.  She has chronic fatigue which has been stable.  She will continue on Plaquenil 200 mg 1 tablet by mouth daily.  A refill was provided to the patient.  We will obtain lab work today.  She will follow up in 5 months.   High risk medication use - Plaquenil 200 mg 1 tablet daily. Last Plaquenil eye exam normal on 02/03/2017.  She has an eye exam scheduled in 1 week. Most recent CBC/CMP within normal limits. Due for CBC/CMP today will monitor every 5 months.  - Plan: CBC with Differential/Platelet, COMPLETE METABOLIC PANEL WITH GFR  Primary osteoarthritis of both hands - She has no tenderness or synovitis on exam.  She has complete fist formation bilaterally.she has no joint pain or joint swelling at this time.  Joint  protection and muscle strengthening were discussed.   Psoriasis - She has no active psoriasis at this time.   Other medical conditions are listed as follows:   History of vitamin D deficiency   H/O rosacea   Coccygodynia  History of hypertension   Orders: Orders Placed This Encounter  Procedures  . COMPLETE METABOLIC PANEL WITH GFR  . CBC with Differential/Platelet  . Anti-DNA antibody, double-stranded  . C3 and C4  . Sedimentation rate  . Urinalysis, Routine w reflex microscopic   Meds ordered this encounter  Medications  . hydroxychloroquine (PLAQUENIL) 200 MG tablet    Sig: Take 1 tablet (200 mg total) by mouth daily.    Dispense:  90 tablet    Refill:  0      Follow-Up Instructions: Return in about 5 months (around 05/09/2019) for Autoimmune Disease, Osteoarthritis.   Gearldine Bienenstockaylor M Jaleena Viviani, PA-C  Note - This record has been created using Dragon software.  Chart creation errors have been sought, but may not always  have been located. Such creation errors do not reflect on  the standard of medical care.

## 2018-12-07 ENCOUNTER — Ambulatory Visit (INDEPENDENT_AMBULATORY_CARE_PROVIDER_SITE_OTHER): Payer: Managed Care, Other (non HMO) | Admitting: Physician Assistant

## 2018-12-07 ENCOUNTER — Other Ambulatory Visit: Payer: Self-pay

## 2018-12-07 ENCOUNTER — Encounter: Payer: Self-pay | Admitting: Physician Assistant

## 2018-12-07 VITALS — BP 146/84 | HR 71 | Resp 13 | Ht 67.5 in | Wt 164.2 lb

## 2018-12-07 DIAGNOSIS — L409 Psoriasis, unspecified: Secondary | ICD-10-CM | POA: Diagnosis not present

## 2018-12-07 DIAGNOSIS — M533 Sacrococcygeal disorders, not elsewhere classified: Secondary | ICD-10-CM

## 2018-12-07 DIAGNOSIS — Z8639 Personal history of other endocrine, nutritional and metabolic disease: Secondary | ICD-10-CM

## 2018-12-07 DIAGNOSIS — Z8679 Personal history of other diseases of the circulatory system: Secondary | ICD-10-CM

## 2018-12-07 DIAGNOSIS — M19042 Primary osteoarthritis, left hand: Secondary | ICD-10-CM

## 2018-12-07 DIAGNOSIS — M359 Systemic involvement of connective tissue, unspecified: Secondary | ICD-10-CM | POA: Diagnosis not present

## 2018-12-07 DIAGNOSIS — Z872 Personal history of diseases of the skin and subcutaneous tissue: Secondary | ICD-10-CM

## 2018-12-07 DIAGNOSIS — Z79899 Other long term (current) drug therapy: Secondary | ICD-10-CM | POA: Diagnosis not present

## 2018-12-07 DIAGNOSIS — M19041 Primary osteoarthritis, right hand: Secondary | ICD-10-CM

## 2018-12-07 MED ORDER — HYDROXYCHLOROQUINE SULFATE 200 MG PO TABS: 200.0000 mg | ORAL_TABLET | Freq: Every day | ORAL | 0 refills | Status: DC

## 2018-12-08 LAB — COMPLETE METABOLIC PANEL WITH GFR
AG Ratio: 1.6 (calc) (ref 1.0–2.5)
ALT: 15 U/L (ref 6–29)
AST: 17 U/L (ref 10–35)
Albumin: 4.6 g/dL (ref 3.6–5.1)
Alkaline phosphatase (APISO): 69 U/L (ref 37–153)
BUN/Creatinine Ratio: 16 (calc) (ref 6–22)
BUN: 19 mg/dL (ref 7–25)
CO2: 29 mmol/L (ref 20–32)
Calcium: 9.6 mg/dL (ref 8.6–10.4)
Chloride: 103 mmol/L (ref 98–110)
Creat: 1.17 mg/dL — ABNORMAL HIGH (ref 0.50–0.99)
GFR, Est African American: 59 mL/min/{1.73_m2} — ABNORMAL LOW (ref >=60)
GFR, Est Non African American: 51 mL/min/{1.73_m2} — ABNORMAL LOW (ref >=60)
Globulin: 2.8 g/dL (calc) (ref 1.9–3.7)
Glucose, Bld: 92 mg/dL (ref 65–99)
Potassium: 3.8 mmol/L (ref 3.5–5.3)
Sodium: 140 mmol/L (ref 135–146)
Total Bilirubin: 0.5 mg/dL (ref 0.2–1.2)
Total Protein: 7.4 g/dL (ref 6.1–8.1)

## 2018-12-08 LAB — URINALYSIS, ROUTINE W REFLEX MICROSCOPIC
Bilirubin Urine: NEGATIVE
Glucose, UA: NEGATIVE
Hgb urine dipstick: NEGATIVE
Ketones, ur: NEGATIVE
Leukocytes,Ua: NEGATIVE
Nitrite: NEGATIVE
Protein, ur: NEGATIVE
Specific Gravity, Urine: 1.018 (ref 1.001–1.03)
pH: 6 (ref 5.0–8.0)

## 2018-12-08 LAB — CBC WITH DIFFERENTIAL/PLATELET
Absolute Monocytes: 281 cells/uL (ref 200–950)
Basophils Absolute: 19 cells/uL (ref 0–200)
Basophils Relative: 0.5 %
Eosinophils Absolute: 49 cells/uL (ref 15–500)
Eosinophils Relative: 1.3 %
HCT: 37.3 % (ref 35.0–45.0)
Hemoglobin: 12.5 g/dL (ref 11.7–15.5)
Lymphs Abs: 942 cells/uL (ref 850–3900)
MCH: 30.3 pg (ref 27.0–33.0)
MCHC: 33.5 g/dL (ref 32.0–36.0)
MCV: 90.5 fL (ref 80.0–100.0)
MPV: 9.9 fL (ref 7.5–12.5)
Monocytes Relative: 7.4 %
Neutro Abs: 2508 cells/uL (ref 1500–7800)
Neutrophils Relative %: 66 %
Platelets: 223 10*3/uL (ref 140–400)
RBC: 4.12 10*6/uL (ref 3.80–5.10)
RDW: 12.7 % (ref 11.0–15.0)
Total Lymphocyte: 24.8 %
WBC: 3.8 10*3/uL (ref 3.8–10.8)

## 2018-12-08 LAB — ANTI-DNA ANTIBODY, DOUBLE-STRANDED: ds DNA Ab: 1 IU/mL

## 2018-12-08 LAB — SEDIMENTATION RATE: Sed Rate: 17 mm/h (ref 0–30)

## 2018-12-08 LAB — C3 AND C4
C3 Complement: 117 mg/dL (ref 83–193)
C4 Complement: 28 mg/dL (ref 15–57)

## 2018-12-08 NOTE — Progress Notes (Signed)
Creatinine is elevated.  Please advise patient to avoid NSAIDs.  She is taking HCTZ.  Please forward labs to PCP.  Rest of CMP WNL.  CBC WNL.  UA WNL.  Sed rate WNL.  Complements WNL. DsDNA is 1.  Labs do not reveal signs of a flare.

## 2019-03-19 ENCOUNTER — Other Ambulatory Visit: Payer: Self-pay | Admitting: Rheumatology

## 2019-03-19 DIAGNOSIS — M359 Systemic involvement of connective tissue, unspecified: Secondary | ICD-10-CM

## 2019-03-19 MED ORDER — HYDROXYCHLOROQUINE SULFATE 200 MG PO TABS: 200.0000 mg | ORAL_TABLET | Freq: Every day | ORAL | 0 refills | Status: DC

## 2019-03-19 NOTE — Telephone Encounter (Signed)
Last Visit: 12/07/18 Next Visit: 05/10/19  Labs: 12/07/18 Creatinine is elevated. Rest of CMP WNL. CBC WNL. PLQ Eye Exam:  02/03/17 WNL  Patient states she updated eye exam on 12/14/18. Patient states she will contact eye doctor to have them send it over.  Okay to refill 30 day supply PLQ?

## 2019-03-19 NOTE — Telephone Encounter (Signed)
Received PLQ eye exam: 12/14/18 WNL

## 2019-03-19 NOTE — Telephone Encounter (Signed)
Patient left a voicemail requesting prescription refill of Plaquenil 90 day supply to be sent to [facility] on [address].

## 2019-03-19 NOTE — Telephone Encounter (Signed)
ok 

## 2019-04-26 NOTE — Progress Notes (Deleted)
Office Visit Note  Patient: Suzanne [name]             Date of Birth: Aug 23, 1960           MRN: [id number]             PCP: Ronita Hipps, MD Referring: Ronita Hipps, MD Visit Date: 05/10/2019 Occupation: @GUAROCC @  Subjective:  No chief complaint on file.   History of Present Illness: Suzanne [name] is a 58 y.o. female ***   Activities of Daily Living:  Patient reports morning stiffness for *** {minute/hour:19697}.   Patient {ACTIONS;DENIES/REPORTS:21021675::"Denies"} nocturnal pain.  Difficulty dressing/grooming: {ACTIONS;DENIES/REPORTS:21021675::"Denies"} Difficulty climbing stairs: {ACTIONS;DENIES/REPORTS:21021675::"Denies"} Difficulty getting out of chair: {ACTIONS;DENIES/REPORTS:21021675::"Denies"} Difficulty using hands for taps, buttons, cutlery, and/or writing: {ACTIONS;DENIES/REPORTS:21021675::"Denies"}  No Rheumatology ROS completed.   PMFS History:  Patient Active Problem List   Diagnosis Date Noted  . Autoimmune disease ([place]) 08/26/2016  . High risk medication use 08/26/2016  . Psoriasis 08/26/2016  . Primary osteoarthritis of both hands 08/26/2016  . History of Sjogren's disease ([place]) 08/26/2016  . Pain, joint, knee, left 08/26/2016  . History of hypertension 08/26/2016  . H/O rosacea 08/26/2016    Past Medical History:  Diagnosis Date  . Hypertension     Family History  Problem Relation Age of Onset  . Hypertension Mother   . Stroke Mother   . Hypercholesterolemia Mother   . Cancer Father   . Clotting disorder Daughter    No past surgical history on file. Social History   Social History Narrative  . Not on file   Immunization History  Administered Date(s) Administered  . Influenza,inj,Quad PF,6+ Mos 03/23/2017     Objective: Vital Signs: There were no vitals taken for this visit.   Physical Exam   Musculoskeletal Exam: ***  CDAI Exam: CDAI Score: - Patient Global: -; Provider Global: - Swollen: -; Tender: - Joint Exam    No joint exam has been documented for this visit   There is currently no information documented on the homunculus. Go to the Rheumatology activity and complete the homunculus joint exam.  Investigation: No additional findings.  Imaging: No results found.  Recent Labs: Lab Results  Component Value Date   WBC 3.8 12/07/2018   HGB 12.5 12/07/2018   PLT 223 12/07/2018   NA 140 12/07/2018   K 3.8 12/07/2018   CL 103 12/07/2018   CO2 29 12/07/2018   GLUCOSE 92 12/07/2018   BUN 19 12/07/2018   CREATININE 1.17 (H) 12/07/2018   BILITOT 0.5 12/07/2018   ALKPHOS 59 08/05/2016   AST 17 12/07/2018   ALT 15 12/07/2018   PROT 7.4 12/07/2018   ALBUMIN 4.4 08/05/2016   CALCIUM 9.6 12/07/2018   GFRAA 59 (L) 12/07/2018    Speciality Comments: PLaquenil eye exam normal on 12/14/18 WNL per [facility]   Procedures:  No procedures performed Allergies: Patient has no known allergies.   Assessment / Plan:     Visit Diagnoses: No diagnosis found.  Orders: No orders of the defined types were placed in this encounter.  No orders of the defined types were placed in this encounter.   Face-to-face time spent with patient was *** minutes. Greater than 50% of time was spent in counseling and coordination of care.  Follow-Up Instructions: No follow-ups on file.   Earnestine Mealing, CMA  Note - This record has been created using Editor, commissioning.  Chart creation errors have been sought, but may not always  have  been located. Such creation errors do not reflect on  the standard of medical care.

## 2019-05-10 ENCOUNTER — Telehealth: Payer: Self-pay | Admitting: Rheumatology

## 2019-05-10 ENCOUNTER — Other Ambulatory Visit: Payer: Self-pay

## 2019-05-10 ENCOUNTER — Telehealth (INDEPENDENT_AMBULATORY_CARE_PROVIDER_SITE_OTHER): Payer: Managed Care, Other (non HMO) | Admitting: Rheumatology

## 2019-05-10 ENCOUNTER — Encounter: Payer: Self-pay | Admitting: Physician Assistant

## 2019-05-10 DIAGNOSIS — M359 Systemic involvement of connective tissue, unspecified: Secondary | ICD-10-CM

## 2019-05-10 DIAGNOSIS — Z79899 Other long term (current) drug therapy: Secondary | ICD-10-CM | POA: Diagnosis not present

## 2019-05-10 DIAGNOSIS — Z8639 Personal history of other endocrine, nutritional and metabolic disease: Secondary | ICD-10-CM

## 2019-05-10 DIAGNOSIS — L409 Psoriasis, unspecified: Secondary | ICD-10-CM | POA: Diagnosis not present

## 2019-05-10 DIAGNOSIS — Z8679 Personal history of other diseases of the circulatory system: Secondary | ICD-10-CM

## 2019-05-10 DIAGNOSIS — M19041 Primary osteoarthritis, right hand: Secondary | ICD-10-CM

## 2019-05-10 DIAGNOSIS — M19042 Primary osteoarthritis, left hand: Secondary | ICD-10-CM

## 2019-05-10 DIAGNOSIS — Z872 Personal history of diseases of the skin and subcutaneous tissue: Secondary | ICD-10-CM

## 2019-05-10 DIAGNOSIS — M533 Sacrococcygeal disorders, not elsewhere classified: Secondary | ICD-10-CM

## 2019-05-10 MED ORDER — HYDROXYCHLOROQUINE SULFATE 200 MG PO TABS: 200.0000 mg | ORAL_TABLET | Freq: Every day | ORAL | 0 refills | Status: DC

## 2019-05-10 NOTE — Addendum Note (Signed)
Addended by: Earnestine Mealing on: 05/10/2019 03:47 PM   Modules accepted: Orders

## 2019-05-10 NOTE — Telephone Encounter (Signed)
Patient called requesting her labwork orders be faxed to [facility].  Fax [phone]  Patient states once they receive the labwork orders they will call her to schedule an appointment.

## 2019-05-10 NOTE — Progress Notes (Signed)
Virtual Visit via Telephone Note  I connected with Suzanne [name] on 05/10/19 at  9:20 AM EST by telephone and verified that I am speaking with the correct person using two identifiers.  Location: Patient: Home  Provider: Clinic  This service was conducted via virtual visit. The patient was located at home. I was located in my office.  Consent was obtained prior to the virtual visit and is aware of possible charges through their insurance for this visit.  The patient is an established patient.  Dr. Corliss Skains, MD conducted the virtual visit and Sherron Ales, PA-C acted as scribe during the service.  Office staff helped with scheduling follow up visits after the service was conducted.   I discussed the limitations, risks, security and privacy concerns of performing an evaluation and management service by telephone and the availability of in person appointments. I also discussed with the patient that there may be a patient responsible charge related to this service. The patient expressed understanding and agreed to proceed.  CC: Pain in both hands  History of Present Illness: Patient is a 58 year old female with a past medical history of autoimmune disease.  She is taking Plaquenil 200 mg 1 tablet by mouth daily. She has not had any recent rashes.  She has chronic eye dryness but no mouth dryness.  She has not had any sores in mouth or nose.  She denies any fevers or enlarged lymph nodes.   She continues to have pain in both hand and both knee joints intermittently. She denies any joint swelling.   Review of Systems  Constitutional: Negative for fever and malaise/fatigue.  Eyes: Negative for photophobia, pain, discharge and redness.       +Dry eyes  Respiratory: Negative for cough, shortness of breath and wheezing.   Cardiovascular: Negative for chest pain and palpitations.  Gastrointestinal: Negative for blood in stool, constipation and diarrhea.  Genitourinary: Negative for dysuria.   Musculoskeletal: Positive for joint pain. Negative for back pain, myalgias and neck pain.  Skin: Negative for rash.  Neurological: Negative for dizziness and headaches.  Psychiatric/Behavioral: Negative for depression. The patient is not nervous/anxious and does not have insomnia.       Observations/Objective: Physical Exam  Constitutional: She is oriented to person, place, and time.  Neurological: She is alert and oriented to person, place, and time.  Psychiatric: Mood, memory, affect and judgment normal.   Patient reports morning stiffness for 20  minutes.   Patient denies nocturnal pain.  Difficulty dressing/grooming: Denies Difficulty climbing stairs: Denies Difficulty getting out of chair: Denies Difficulty using hands for taps, buttons, cutlery, and/or writing: Denies   Assessment and Plan: Visit Diagnoses: Autoimmune disease (HCC) - +ANA, +RF, +RO, +LA, +ACE. h/o sicca symptoms, malar rash, fatigue, neutropenia: She has not had any signs or symptoms of a flare recently.  She is clinically doing well on Plaquenil 200 mg 1 tablet by mouth daily.  She continues to have intermittent pain in both hands and both knee joints but no joint swelling.  She has not had any recent rashes, photosensitivity, or hair loss.  She has chronic eye dryness but no mouth dryness.  No oral or nasal ulcerations.  She has not had any recent fevers or enlarged lymph nodes.  She will continue taking PLQ as prescribed.  A refill of PLQ will be sent to the pharmacy.  She is due to update lab work.  Orders will be placed today.  She will follow up in  3-4 months.    High risk medication use - Plaquenil 200 mg 1 tablet daily.  PLaquenil eye exam normal on 12/14/18 WNL per [facility]. CBC WNL and CMP: creatinine 1.17 and GFR 51.  She is due to update lab work.  Orders will be placed today.  Primary osteoarthritis of both hands -  She has intermittent pain in both hands.  No joint swelling.  She has no  difficulty with ADLs. Joint protection and muscle strengthening were discussed.   Psoriasis - She has no active psoriasis at this time.   History of vitamin D deficiency: She is taking a vitamin D supplement.  Other medical conditions are listed as follows:   H/O rosacea   Coccygodynia  History of hypertension   Follow Up Instructions: She will follow up in 3-4 months.   I discussed the assessment and treatment plan with the patient. The patient was provided an opportunity to ask questions and all were answered. The patient agreed with the plan and demonstrated an understanding of the instructions.   The patient was advised to call back or seek an in-person evaluation if the symptoms worsen or if the condition fails to improve as anticipated.  I provided 15 minutes of non-face-to-face time during this encounter.   Bo Merino, MD  Scribed by- Hazel Sams PA-C

## 2019-05-11 ENCOUNTER — Encounter: Payer: Self-pay | Admitting: Rheumatology

## 2019-05-11 NOTE — Telephone Encounter (Signed)
Lab orders have been faxed. Patient has been notified.

## 2019-05-28 ENCOUNTER — Telehealth: Payer: Self-pay | Admitting: Rheumatology

## 2019-05-28 NOTE — Telephone Encounter (Signed)
I called patient, labs received pending Dr. Arlean Hopping review.

## 2019-05-28 NOTE — Telephone Encounter (Signed)
Patient had labs done at [facility] done around 05/17/2019. Patient was calling because she did not receive results. Please call to advise.

## 2019-06-03 ENCOUNTER — Telehealth: Payer: Self-pay

## 2019-06-03 NOTE — Telephone Encounter (Signed)
Advised patient of lab results, patient verbalized understanding.   Patient will call the office prior to 06/10/2019 for order to be sent for CBC.

## 2019-06-03 NOTE — Telephone Encounter (Signed)
Lab results received via fax.  05/11/2019  CBC, CMP, ESR, dsDNA, C3 & C4, UA.  WBC 2.9, RDW 11.2, lymph# 0.7, dsDNA <8.0, c3 138, c4 27, ESR 16.   Hazel Sams, PA-C reviewed labs with Dr. Estanislado Pandy and we will recheck CBC in 1 month.

## 2019-06-21 ENCOUNTER — Other Ambulatory Visit: Payer: Self-pay | Admitting: Rheumatology

## 2019-06-21 DIAGNOSIS — M359 Systemic involvement of connective tissue, unspecified: Secondary | ICD-10-CM

## 2019-06-22 MED ORDER — HYDROXYCHLOROQUINE SULFATE 200 MG PO TABS: 200.0000 mg | ORAL_TABLET | Freq: Every day | ORAL | 0 refills | Status: DC

## 2019-06-22 NOTE — Telephone Encounter (Addendum)
Last Visit: 05/10/19 Next Visit: 09/08/19 Labs: 05/11/19  WBC 2.9, RDW 11.2, lymph# 0.7, dsDNA <8.0, c3 138, c4 27, ESR 16.  Plaquenil eye exam normal on 12/14/18 WNL   Okay to refill per Dr. Estanislado Pandy

## 2019-07-01 ENCOUNTER — Other Ambulatory Visit: Payer: Self-pay

## 2019-07-01 DIAGNOSIS — Z79899 Other long term (current) drug therapy: Secondary | ICD-10-CM

## 2019-07-08 ENCOUNTER — Telehealth: Payer: Self-pay | Admitting: *Deleted

## 2019-07-08 NOTE — Telephone Encounter (Signed)
Labs received from [facility], [place]. All results normal - CBC w/diff, drawn on 07/01/2019. Reviewed by Dr. Pollyann Savoy

## 2019-08-30 NOTE — Progress Notes (Signed)
Office Visit Note  Patient: Suzanne [name]             Date of Birth: 12/15/1960           MRN: [id number]             PCP: Ronita Hipps, MD Referring: Ronita Hipps, MD Visit Date: 09/08/2019 Occupation: @GUAROCC @  Subjective:  Pain in both shoulders.   History of Present Illness: Jull Harral is a 59 y.o. female with history of autoimmune disease, osteoarthritis and psoriasis.  She states she still has intermittent bilateral rash.  She denies any history of oral ulcers or nasal ulcers.  She continues to have some sicca symptoms for which she has been using over-the-counter products.  She states her work is very strenuous and she has been having some discomfort in her bilateral shoulders.  She has discomfort in her left shoulder joint at night.  She denies any joint swelling.  She states she had some lower back pain few days back which is resolved now.  She has been taking Plaquenil on a regular basis.  Activities of Daily Living:  Patient reports morning stiffness for 30 minutes.   Patient Denies nocturnal pain.  Difficulty dressing/grooming: Denies Difficulty climbing stairs: Denies Difficulty getting out of chair: Denies Difficulty using hands for taps, buttons, cutlery, and/or writing: Denies  Review of Systems  Constitutional: Positive for fatigue.  HENT: Negative for mouth sores, mouth dryness and nose dryness.   Eyes: Positive for dryness. Negative for pain and itching.  Respiratory: Negative for shortness of breath, wheezing and difficulty breathing.   Cardiovascular: Negative for chest pain and palpitations.  Gastrointestinal: Positive for constipation and diarrhea. Negative for blood in stool.  Endocrine: Negative for increased urination.  Genitourinary: Negative for difficulty urinating and painful urination.  Musculoskeletal: Positive for arthralgias, joint pain, myalgias, morning stiffness, muscle tenderness and myalgias. Negative for joint swelling.    Skin: Positive for rash. Negative for hair loss.  Allergic/Immunologic: Negative for susceptible to infections.  Neurological: Positive for memory loss. Negative for dizziness, numbness, headaches and weakness.  Hematological: Negative for bruising/bleeding tendency.  Psychiatric/Behavioral: Negative for confusion and sleep disturbance.    PMFS History:  Patient Active Problem List   Diagnosis Date Noted  . Autoimmune disease ([place]) 08/26/2016  . High risk medication use 08/26/2016  . Psoriasis 08/26/2016  . Primary osteoarthritis of both hands 08/26/2016  . History of Sjogren's disease ([place]) 08/26/2016  . Pain, joint, knee, left 08/26/2016  . History of hypertension 08/26/2016  . H/O rosacea 08/26/2016    Past Medical History:  Diagnosis Date  . Hypertension     Family History  Problem Relation Age of Onset  . Hypertension Mother   . Stroke Mother   . Hypercholesterolemia Mother   . Cancer Father   . Clotting disorder Daughter   . Hashimoto's thyroiditis Daughter   . Rheum arthritis Daughter    History reviewed. No pertinent surgical history. Social History   Social History Narrative  . Not on file   Immunization History  Administered Date(s) Administered  . Influenza,inj,Quad PF,6+ Mos 03/23/2017     Objective: Vital Signs: BP 135/80 (BP Location: Left Arm, Patient Position: Sitting, Cuff Size: Normal)   Pulse 70   Resp 13   Ht 5\' 7"  (1.702 m)   Wt 163 lb (73.9 kg)   BMI 25.53 kg/m    Physical Exam Vitals and nursing note reviewed.  Constitutional:  Appearance: She is well-developed.  HENT:     Head: Normocephalic and atraumatic.  Eyes:     Conjunctiva/sclera: Conjunctivae normal.  Cardiovascular:     Rate and Rhythm: Normal rate and regular rhythm.     Heart sounds: Normal heart sounds.  Pulmonary:     Effort: Pulmonary effort is normal.     Breath sounds: Normal breath sounds.  Abdominal:     General: Bowel sounds are normal.      Palpations: Abdomen is soft.  Musculoskeletal:     Cervical back: Normal range of motion.  Lymphadenopathy:     Cervical: No cervical adenopathy.  Skin:    General: Skin is warm and dry.     Capillary Refill: Capillary refill takes less than 2 seconds.     Comments: Mild malar rash was noted  Neurological:     Mental Status: She is alert and oriented to person, place, and time.  Psychiatric:        Behavior: Behavior normal.      Musculoskeletal Exam: C-spine thoracic and lumbar spine with good range of motion.  Shoulder joints elbow joints wrist joints with good range of motion.  She has some discomfort range of motion of her shoulder joints.  She has no MCP or PIP swelling.  Hip joints, knee joints, ankles, MTPs with good range of motion with no synovitis.  CDAI Exam: CDAI Score: -- Patient Global: --; Provider Global: -- Swollen: --; Tender: -- Joint Exam 09/08/2019   No joint exam has been documented for this visit   There is currently no information documented on the homunculus. Go to the Rheumatology activity and complete the homunculus joint exam.  Investigation: No additional findings.  Imaging: No results found.  Recent Labs: Lab Results  Component Value Date   WBC 3.8 12/07/2018   HGB 12.5 12/07/2018   PLT 223 12/07/2018   NA 140 12/07/2018   K 3.8 12/07/2018   CL 103 12/07/2018   CO2 29 12/07/2018   GLUCOSE 92 12/07/2018   BUN 19 12/07/2018   CREATININE 1.17 (H) 12/07/2018   BILITOT 0.5 12/07/2018   ALKPHOS 59 08/05/2016   AST 17 12/07/2018   ALT 15 12/07/2018   PROT 7.4 12/07/2018   ALBUMIN 4.4 08/05/2016   CALCIUM 9.6 12/07/2018   GFRAA 59 (L) 12/07/2018    Speciality Comments: PLaquenil eye exam normal on 12/14/18 WNL per [facility]   Procedures:  No procedures performed Allergies: Patient has no known allergies.   Assessment / Plan:     Visit Diagnoses: Autoimmune disease (HCC) - +ANA, +RF, +RO, +LA, +ACE. h/o sicca symptoms,  malar rash, fatigue, neutropenia -patient has been tolerating Plaquenil well.  She continues to have some sicca symptoms.  No synovitis was noted on examination today.  We will obtain autoimmune labs today.  Plan: hydroxychloroquine (PLAQUENIL) 200 MG tablet, Urinalysis, Routine w reflex microscopic, ANA, Anti-DNA antibody, double-stranded, C3 and C4, Sedimentation rate, Sjogrens syndrome-A extractable nuclear antibody, Sjogrens syndrome-B extractable nuclear antibody  High risk medication use - Plaquenil 200 mg 1 tablet daily.  PLaquenil eye exam normal on 12/14/18 - Plan: CBC with Differential/Platelet, COMPLETE METABOLIC PANEL WITH GFR  Primary osteoarthritis of both hands-joint protection was discussed.  Pain in both shoulders-she had good range of motion with some discomfort.  I believe it is related to the work in [employer] she does.  Have given her shoulder joint exercises to see the response.  Psoriasis-she currently does not have any active  lesions.  H/O rosacea-she had mild erythema on her face.  History of vitamin D deficiency  History of hypertension  Coccygodynia-improved.  Orders: Orders Placed This Encounter  Procedures  . CBC with Differential/Platelet  . COMPLETE METABOLIC PANEL WITH GFR  . Urinalysis, Routine w reflex microscopic  . ANA  . Anti-DNA antibody, double-stranded  . C3 and C4  . Sedimentation rate  . Sjogrens syndrome-A extractable nuclear antibody  . Sjogrens syndrome-B extractable nuclear antibody   Meds ordered this encounter  Medications  . hydroxychloroquine (PLAQUENIL) 200 MG tablet    Sig: Take 1 tablet (200 mg total) by mouth daily.    Dispense:  90 tablet    Refill:  0     Follow-Up Instructions: Return in about 5 months (around 02/08/2020) for Autoimmune disease.   Pollyann Savoy, MD  Note - This record has been created using Animal nutritionist.  Chart creation errors have been sought, but may not always  have been located. Such  creation errors do not reflect on  the standard of medical care.

## 2019-09-08 ENCOUNTER — Encounter: Payer: Self-pay | Admitting: Rheumatology

## 2019-09-08 ENCOUNTER — Ambulatory Visit (INDEPENDENT_AMBULATORY_CARE_PROVIDER_SITE_OTHER): Payer: 59 | Admitting: Rheumatology

## 2019-09-08 ENCOUNTER — Other Ambulatory Visit: Payer: Self-pay

## 2019-09-08 VITALS — BP 135/80 | HR 70 | Resp 13 | Ht 67.0 in | Wt 163.0 lb

## 2019-09-08 DIAGNOSIS — M533 Sacrococcygeal disorders, not elsewhere classified: Secondary | ICD-10-CM

## 2019-09-08 DIAGNOSIS — Z8679 Personal history of other diseases of the circulatory system: Secondary | ICD-10-CM

## 2019-09-08 DIAGNOSIS — L409 Psoriasis, unspecified: Secondary | ICD-10-CM | POA: Diagnosis not present

## 2019-09-08 DIAGNOSIS — M359 Systemic involvement of connective tissue, unspecified: Secondary | ICD-10-CM | POA: Diagnosis not present

## 2019-09-08 DIAGNOSIS — M19041 Primary osteoarthritis, right hand: Secondary | ICD-10-CM

## 2019-09-08 DIAGNOSIS — Z79899 Other long term (current) drug therapy: Secondary | ICD-10-CM | POA: Diagnosis not present

## 2019-09-08 DIAGNOSIS — Z872 Personal history of diseases of the skin and subcutaneous tissue: Secondary | ICD-10-CM

## 2019-09-08 DIAGNOSIS — M19042 Primary osteoarthritis, left hand: Secondary | ICD-10-CM

## 2019-09-08 DIAGNOSIS — Z8639 Personal history of other endocrine, nutritional and metabolic disease: Secondary | ICD-10-CM

## 2019-09-08 MED ORDER — HYDROXYCHLOROQUINE SULFATE 200 MG PO TABS: 200.0000 mg | ORAL_TABLET | Freq: Every day | ORAL | 0 refills | Status: DC

## 2019-09-08 NOTE — Patient Instructions (Signed)
Shoulder Exercises Ask your health care provider which exercises are safe for you. Do exercises exactly as told by your health care provider and adjust them as directed. It is normal to feel mild stretching, pulling, tightness, or discomfort as you do these exercises. Stop right away if you feel sudden pain or your pain gets worse. Do not begin these exercises until told by your health care provider. Stretching exercises External rotation and abduction This exercise is sometimes called corner stretch. This exercise rotates your arm outward (external rotation) and moves your arm out from your body (abduction). 1. Stand in a doorway with one of your feet slightly in front of the other. This is called a staggered stance. If you cannot reach your forearms to the door frame, stand facing a corner of a room. 2. Choose one of the following positions as told by your health care provider: ? Place your hands and forearms on the door frame above your head. ? Place your hands and forearms on the door frame at the height of your head. ? Place your hands on the door frame at the height of your elbows. 3. Slowly move your weight onto your front foot until you feel a stretch across your chest and in the front of your shoulders. Keep your head and chest upright and keep your abdominal muscles tight. 4. Hold for __________ seconds. 5. To release the stretch, shift your weight to your back foot. Repeat __________ times. Complete this exercise __________ times a day. Extension, standing 1. Stand and hold a broomstick, a cane, or a similar object behind your back. ? Your hands should be a little wider than shoulder width apart. ? Your palms should face away from your back. 2. Keeping your elbows straight and your shoulder muscles relaxed, move the stick away from your body until you feel a stretch in your shoulders (extension). ? Avoid shrugging your shoulders while you move the stick. Keep your shoulder blades tucked  down toward the middle of your back. 3. Hold for __________ seconds. 4. Slowly return to the starting position. Repeat __________ times. Complete this exercise __________ times a day. Range-of-motion exercises Pendulum  1. Stand near a wall or a surface that you can hold onto for balance. 2. Bend at the waist and let your left / right arm hang straight down. Use your other arm to support you. Keep your back straight and do not lock your knees. 3. Relax your left / right arm and shoulder muscles, and move your hips and your trunk so your left / right arm swings freely. Your arm should swing because of the motion of your body, not because you are using your arm or shoulder muscles. 4. Keep moving your hips and trunk so your arm swings in the following directions, as told by your health care provider: ? Side to side. ? Forward and backward. ? In clockwise and counterclockwise circles. 5. Continue each motion for __________ seconds, or for as long as told by your health care provider. 6. Slowly return to the starting position. Repeat __________ times. Complete this exercise __________ times a day. Shoulder flexion, standing  1. Stand and hold a broomstick, a cane, or a similar object. Place your hands a little more than shoulder width apart on the object. Your left / right hand should be palm up, and your other hand should be palm down. 2. Keep your elbow straight and your shoulder muscles relaxed. Push the stick up with your healthy arm to   raise your left / right arm in front of your body, and then over your head until you feel a stretch in your shoulder (flexion). ? Avoid shrugging your shoulder while you raise your arm. Keep your shoulder blade tucked down toward the middle of your back. 3. Hold for __________ seconds. 4. Slowly return to the starting position. Repeat __________ times. Complete this exercise __________ times a day. Shoulder abduction, standing 1. Stand and hold a broomstick,  a cane, or a similar object. Place your hands a little more than shoulder width apart on the object. Your left / right hand should be palm up, and your other hand should be palm down. 2. Keep your elbow straight and your shoulder muscles relaxed. Push the object across your body toward your left / right side. Raise your left / right arm to the side of your body (abduction) until you feel a stretch in your shoulder. ? Do not raise your arm above shoulder height unless your health care provider tells you to do that. ? If directed, raise your arm over your head. ? Avoid shrugging your shoulder while you raise your arm. Keep your shoulder blade tucked down toward the middle of your back. 3. Hold for __________ seconds. 4. Slowly return to the starting position. Repeat __________ times. Complete this exercise __________ times a day. Internal rotation  1. Place your left / right hand behind your back, palm up. 2. Use your other hand to dangle an exercise band, a towel, or a similar object over your shoulder. Grasp the band with your left / right hand so you are holding on to both ends. 3. Gently pull up on the band until you feel a stretch in the front of your left / right shoulder. The movement of your arm toward the center of your body is called internal rotation. ? Avoid shrugging your shoulder while you raise your arm. Keep your shoulder blade tucked down toward the middle of your back. 4. Hold for __________ seconds. 5. Release the stretch by letting go of the band and lowering your hands. Repeat __________ times. Complete this exercise __________ times a day. Strengthening exercises External rotation  1. Sit in a stable chair without armrests. 2. Secure an exercise band to a stable object at elbow height on your left / right side. 3. Place a soft object, such as a folded towel or a small pillow, between your left / right upper arm and your body to move your elbow about 4 inches (10 cm) away  from your side. 4. Hold the end of the exercise band so it is tight and there is no slack. 5. Keeping your elbow pressed against the soft object, slowly move your forearm out, away from your abdomen (external rotation). Keep your body steady so only your forearm moves. 6. Hold for __________ seconds. 7. Slowly return to the starting position. Repeat __________ times. Complete this exercise __________ times a day. Shoulder abduction  1. Sit in a stable chair without armrests, or stand up. 2. Hold a __________ weight in your left / right hand, or hold an exercise band with both hands. 3. Start with your arms straight down and your left / right palm facing in, toward your body. 4. Slowly lift your left / right hand out to your side (abduction). Do not lift your hand above shoulder height unless your health care provider tells you that this is safe. ? Keep your arms straight. ? Avoid shrugging your shoulder while you   do this movement. Keep your shoulder blade tucked down toward the middle of your back. 5. Hold for __________ seconds. 6. Slowly lower your arm, and return to the starting position. Repeat __________ times. Complete this exercise __________ times a day. Shoulder extension 1. Sit in a stable chair without armrests, or stand up. 2. Secure an exercise band to a stable object in front of you so it is at shoulder height. 3. Hold one end of the exercise band in each hand. Your palms should face each other. 4. Straighten your elbows and lift your hands up to shoulder height. 5. Step back, away from the secured end of the exercise band, until the band is tight and there is no slack. 6. Squeeze your shoulder blades together as you pull your hands down to the sides of your thighs (extension). Stop when your hands are straight down by your sides. Do not let your hands go behind your body. 7. Hold for __________ seconds. 8. Slowly return to the starting position. Repeat __________ times.  Complete this exercise __________ times a day. Shoulder row 1. Sit in a stable chair without armrests, or stand up. 2. Secure an exercise band to a stable object in front of you so it is at waist height. 3. Hold one end of the exercise band in each hand. Position your palms so that your thumbs are facing the ceiling (neutral position). 4. Bend each of your elbows to a 90-degree angle (right angle) and keep your upper arms at your sides. 5. Step back until the band is tight and there is no slack. 6. Slowly pull your elbows back behind you. 7. Hold for __________ seconds. 8. Slowly return to the starting position. Repeat __________ times. Complete this exercise __________ times a day. Shoulder press-ups  1. Sit in a stable chair that has armrests. Sit upright, with your feet flat on the floor. 2. Put your hands on the armrests so your elbows are bent and your fingers are pointing forward. Your hands should be about even with the sides of your body. 3. Push down on the armrests and use your arms to lift yourself off the chair. Straighten your elbows and lift yourself up as much as you comfortably can. ? Move your shoulder blades down, and avoid letting your shoulders move up toward your ears. ? Keep your feet on the ground. As you get stronger, your feet should support less of your body weight as you lift yourself up. 4. Hold for __________ seconds. 5. Slowly lower yourself back into the chair. Repeat __________ times. Complete this exercise __________ times a day. Wall push-ups  1. Stand so you are facing a stable wall. Your feet should be about one arm-length away from the wall. 2. Lean forward and place your palms on the wall at shoulder height. 3. Keep your feet flat on the floor as you bend your elbows and lean forward toward the wall. 4. Hold for __________ seconds. 5. Straighten your elbows to push yourself back to the starting position. Repeat __________ times. Complete this exercise  __________ times a day. This information is not intended to replace advice given to you by your health care provider. Make sure you discuss any questions you have with your health care provider. Document Revised: 09/25/2018 Document Reviewed: 07/03/2018 Elsevier Patient Education  2020 [employer]  

## 2019-09-10 LAB — COMPLETE METABOLIC PANEL WITH GFR
AG Ratio: 1.6 (calc) (ref 1.0–2.5)
ALT: 14 U/L (ref 6–29)
AST: 14 U/L (ref 10–35)
Albumin: 4.5 g/dL (ref 3.6–5.1)
Alkaline phosphatase (APISO): 61 U/L (ref 37–153)
BUN: 15 mg/dL (ref 7–25)
CO2: 31 mmol/L (ref 20–32)
Calcium: 9.7 mg/dL (ref 8.6–10.4)
Chloride: 104 mmol/L (ref 98–110)
Creat: 0.83 mg/dL (ref 0.50–0.99)
GFR, Est African American: 89 mL/min/{1.73_m2} (ref >=60)
GFR, Est Non African American: 77 mL/min/{1.73_m2} (ref >=60)
Globulin: 2.9 g/dL (calc) (ref 1.9–3.7)
Glucose, Bld: 97 mg/dL (ref 65–99)
Potassium: 4.3 mmol/L (ref 3.5–5.3)
Sodium: 141 mmol/L (ref 135–146)
Total Bilirubin: 0.5 mg/dL (ref 0.2–1.2)
Total Protein: 7.4 g/dL (ref 6.1–8.1)

## 2019-09-10 LAB — CBC WITH DIFFERENTIAL/PLATELET
Absolute Monocytes: 258 cells/uL (ref 200–950)
Basophils Absolute: 20 cells/uL (ref 0–200)
Basophils Relative: 0.6 %
Eosinophils Absolute: 82 cells/uL (ref 15–500)
Eosinophils Relative: 2.4 %
HCT: 38.8 % (ref 35.0–45.0)
Hemoglobin: 13 g/dL (ref 11.7–15.5)
Lymphs Abs: 700 cells/uL — ABNORMAL LOW (ref 850–3900)
MCH: 30.1 pg (ref 27.0–33.0)
MCHC: 33.5 g/dL (ref 32.0–36.0)
MCV: 89.8 fL (ref 80.0–100.0)
MPV: 9.9 fL (ref 7.5–12.5)
Monocytes Relative: 7.6 %
Neutro Abs: 2339 cells/uL (ref 1500–7800)
Neutrophils Relative %: 68.8 %
Platelets: 231 10*3/uL (ref 140–400)
RBC: 4.32 10*6/uL (ref 3.80–5.10)
RDW: 12.3 % (ref 11.0–15.0)
Total Lymphocyte: 20.6 %
WBC: 3.4 10*3/uL — ABNORMAL LOW (ref 3.8–10.8)

## 2019-09-10 LAB — URINALYSIS, ROUTINE W REFLEX MICROSCOPIC
Bilirubin Urine: NEGATIVE
Glucose, UA: NEGATIVE
Hgb urine dipstick: NEGATIVE
Ketones, ur: NEGATIVE
Leukocytes,Ua: NEGATIVE
Nitrite: NEGATIVE
Protein, ur: NEGATIVE
Specific Gravity, Urine: 1.021 (ref 1.001–1.03)
pH: 7 (ref 5.0–8.0)

## 2019-09-10 LAB — C3 AND C4
C3 Complement: 119 mg/dL (ref 83–193)
C4 Complement: 29 mg/dL (ref 15–57)

## 2019-09-10 LAB — SJOGRENS SYNDROME-A EXTRACTABLE NUCLEAR ANTIBODY: SSA (Ro) (ENA) Antibody, IgG: >8 AI — AB

## 2019-09-10 LAB — ANTI-NUCLEAR AB-TITER (ANA TITER): ANA Titer 1: 1:80 {titer} — ABNORMAL HIGH

## 2019-09-10 LAB — SEDIMENTATION RATE: Sed Rate: 17 mm/h (ref 0–30)

## 2019-09-10 LAB — ANA: Anti Nuclear Antibody (ANA): POSITIVE — AB

## 2019-09-10 LAB — ANTI-DNA ANTIBODY, DOUBLE-STRANDED: ds DNA Ab: <1 IU/mL

## 2019-09-10 LAB — SJOGRENS SYNDROME-B EXTRACTABLE NUCLEAR ANTIBODY: SSB (La) (ENA) Antibody, IgG: <1 AI

## 2019-09-10 NOTE — Progress Notes (Signed)
Positive ANA and positive rheumatoid noted consistent with Sjogren's.  White cell count is low due to Plaquenil use.  Please have patient repeat CBC in 1 month.

## 2019-09-14 ENCOUNTER — Telehealth: Payer: Self-pay | Admitting: *Deleted

## 2019-09-14 DIAGNOSIS — Z79899 Other long term (current) drug therapy: Secondary | ICD-10-CM

## 2019-09-14 NOTE — Telephone Encounter (Signed)
-----   Message from Pollyann Savoy, MD sent at 09/10/2019  8:34 AM EDT ----- Positive ANA and positive rheumatoid noted consistent with Sjogren's.  White cell count is low due to Plaquenil use.  Please have patient repeat CBC in 1 month.

## 2019-10-01 ENCOUNTER — Other Ambulatory Visit: Payer: Self-pay

## 2019-10-01 DIAGNOSIS — Z79899 Other long term (current) drug therapy: Secondary | ICD-10-CM

## 2019-10-02 LAB — COMPLETE METABOLIC PANEL WITH GFR
AG Ratio: 1.4 (calc) (ref 1.0–2.5)
ALT: 13 U/L (ref 6–29)
AST: 14 U/L (ref 10–35)
Albumin: 4.3 g/dL (ref 3.6–5.1)
Alkaline phosphatase (APISO): 63 U/L (ref 37–153)
BUN: 14 mg/dL (ref 7–25)
CO2: 31 mmol/L (ref 20–32)
Calcium: 9.9 mg/dL (ref 8.6–10.4)
Chloride: 103 mmol/L (ref 98–110)
Creat: 0.89 mg/dL (ref 0.50–0.99)
GFR, Est African American: 82 mL/min/{1.73_m2} (ref >=60)
GFR, Est Non African American: 70 mL/min/{1.73_m2} (ref >=60)
Globulin: 3.1 g/dL (calc) (ref 1.9–3.7)
Glucose, Bld: 86 mg/dL (ref 65–99)
Potassium: 4.3 mmol/L (ref 3.5–5.3)
Sodium: 141 mmol/L (ref 135–146)
Total Bilirubin: 0.6 mg/dL (ref 0.2–1.2)
Total Protein: 7.4 g/dL (ref 6.1–8.1)

## 2019-10-02 LAB — CBC WITH DIFFERENTIAL/PLATELET
Absolute Monocytes: 285 cells/uL (ref 200–950)
Basophils Absolute: 30 cells/uL (ref 0–200)
Basophils Relative: 0.8 %
Eosinophils Absolute: 70 cells/uL (ref 15–500)
Eosinophils Relative: 1.9 %
HCT: 37.6 % (ref 35.0–45.0)
Hemoglobin: 12.9 g/dL (ref 11.7–15.5)
Lymphs Abs: 955 cells/uL (ref 850–3900)
MCH: 31.2 pg (ref 27.0–33.0)
MCHC: 34.3 g/dL (ref 32.0–36.0)
MCV: 91 fL (ref 80.0–100.0)
MPV: 13.3 fL — ABNORMAL HIGH (ref 7.5–12.5)
Monocytes Relative: 7.7 %
Neutro Abs: 2361 cells/uL (ref 1500–7800)
Neutrophils Relative %: 63.8 %
Platelets: 194 10*3/uL (ref 140–400)
RBC: 4.13 10*6/uL (ref 3.80–5.10)
RDW: 12.3 % (ref 11.0–15.0)
Total Lymphocyte: 25.8 %
WBC: 3.7 10*3/uL — ABNORMAL LOW (ref 3.8–10.8)

## 2019-10-04 NOTE — Progress Notes (Signed)
WBC is better okay to continue Plaquenil.

## 2019-12-15 ENCOUNTER — Other Ambulatory Visit: Payer: Self-pay | Admitting: Rheumatology

## 2019-12-15 DIAGNOSIS — M359 Systemic involvement of connective tissue, unspecified: Secondary | ICD-10-CM

## 2019-12-15 MED ORDER — HYDROXYCHLOROQUINE SULFATE 200 MG PO TABS: 200.0000 mg | ORAL_TABLET | Freq: Every day | ORAL | 0 refills | Status: DC

## 2019-12-15 NOTE — Telephone Encounter (Signed)
Last visit: 09/08/2019 Next visit: 02/09/2020 Labs: 10/01/2019 WBC is better okay to continue Plaquenil. Plaquenil eye exam normal on 12/14/18 WNL  Current Dose per office note 09/08/2019: Plaquenil 200 mg 1 tablet daily  Patient advised she is due update her PLQ Eye Exam. Patient states it is scheduled for October 2021. Patient advised to contact the eye doctor to see if they have a sooner appointment as it has been a year since her last exam.   Okay to refill PLQ?

## 2020-01-26 NOTE — Progress Notes (Signed)
Office Visit Note  Patient: Suzanne [name]             Date of Birth: 1960-07-09           MRN: [id number]             PCP: Marylen Ponto, MD Referring: Marylen Ponto, MD Visit Date: 02/09/2020 Occupation: @GUAROCC @  Subjective:  Arthralgias   History of Present Illness: Suzanne [name] is a 59 y.o. female with history of autoimmune disease and osteoarthritis. She is taking Plaquenil 200 mg 1 tablet by mouth daily.  She is tolerating PLQ without any side effects.  She continues to have migratory arthralgia and myalgias.  She denies any joint swelling.  She continues to experience significant fatigue after working 12 hours daily. She has ongoing eye dryness and uses systane eyedrops.  She continues to have bouts of rosacea but denies any other rashes.  She has ongoing photosensitivity and tries to avoid direct sun exposure.  She states that she recently had some weight loss and noticed some hair loss simultaneously.  She states that she started to gain weight and her appetite has been improving.  She states she is also noticed some increased hair growth.  She denies any recent oral or nasal ulcerations.  She denies any enlarged lymph nodes.  She denies any chest pain, shortness of breath, coughing, or palpitations. She received her 77 vaccination. She has her Plaquenil eye exam scheduled in October 2021.  Activities of Daily Living:  Patient reports morning stiffness for 30  minutes.   Patient Reports nocturnal pain.  Difficulty dressing/grooming: Denies Difficulty climbing stairs: Denies Difficulty getting out of chair: Denies Difficulty using hands for taps, buttons, cutlery, and/or writing: Denies  Review of Systems  Constitutional: Positive for fatigue.  HENT: Negative for mouth sores, mouth dryness and nose dryness.   Eyes: Positive for itching and dryness.  Respiratory: Negative for shortness of breath and difficulty breathing.   Cardiovascular: Negative  for chest pain and palpitations.  Gastrointestinal: Positive for constipation and diarrhea. Negative for blood in stool.  Endocrine: Negative for increased urination.  Genitourinary: Negative for difficulty urinating.  Musculoskeletal: Positive for arthralgias, joint pain, myalgias, morning stiffness, muscle tenderness and myalgias. Negative for joint swelling.  Skin: Positive for hair loss. Negative for color change, rash and redness.  Allergic/Immunologic: Negative for susceptible to infections.  Neurological: Positive for weakness. Negative for dizziness, numbness, headaches and memory loss.  Hematological: Negative for bruising/bleeding tendency.  Psychiatric/Behavioral: Negative for confusion.    PMFS History:  Patient Active Problem List   Diagnosis Date Noted  . Autoimmune disease (HCC) 08/26/2016  . High risk medication use 08/26/2016  . Psoriasis 08/26/2016  . Primary osteoarthritis of both hands 08/26/2016  . History of Sjogren's disease (HCC) 08/26/2016  . Pain, joint, knee, left 08/26/2016  . History of hypertension 08/26/2016  . H/O rosacea 08/26/2016    Past Medical History:  Diagnosis Date  . Hypertension     Family History  Problem Relation Age of Onset  . Hypertension Mother   . Stroke Mother   . Hypercholesterolemia Mother   . Cancer Father   . Clotting disorder Daughter   . Hashimoto's thyroiditis Daughter   . Rheum arthritis Daughter    History reviewed. No pertinent surgical history. Social History   Social History Narrative  . Not on file   Immunization History  Administered Date(s) Administered  . Influenza,inj,Quad PF,6+ Mos 03/23/2017  Objective: Vital Signs: BP (!) 164/89 (BP Location: Left Arm, Patient Position: Sitting, Cuff Size: Small)   Pulse 60   Resp 13   Ht 5\' 7"  (1.702 m)   Wt 163 lb 6.4 oz (74.1 kg)   BMI 25.59 kg/m    Physical Exam Vitals and nursing note reviewed.  Constitutional:      Appearance: She is  well-developed.  HENT:     Head: Normocephalic and atraumatic.  Eyes:     Conjunctiva/sclera: Conjunctivae normal.  Pulmonary:     Effort: Pulmonary effort is normal.  Abdominal:     Palpations: Abdomen is soft.  Musculoskeletal:     Cervical back: Normal range of motion.  Skin:    General: Skin is warm and dry.     Capillary Refill: Capillary refill takes less than 2 seconds.  Neurological:     Mental Status: She is alert and oriented to person, place, and time.  Psychiatric:        Behavior: Behavior normal.      Musculoskeletal Exam: C-spine, thoracic spine, and lumbar spine good ROM. Shoulder joints, elbow joints, wrist joints, MCPs, PIPs, and DIPs good ROM with no synovitis. PIP and DIP thickening consistent with osteoarthritis of both hands. Complete fist formation bilaterally.  Hip joints, knee joints, and ankle joints good ROM with no discomfort.  No warmth or effusion of knee joints.  No tenderness or swelling of ankle joints.   CDAI Exam: CDAI Score: -- Patient Global: --; Provider Global: -- Swollen: --; Tender: -- Joint Exam 02/09/2020   No joint exam has been documented for this visit   There is currently no information documented on the homunculus. Go to the Rheumatology activity and complete the homunculus joint exam.  Investigation: No additional findings.  Imaging: No results found.  Recent Labs: Lab Results  Component Value Date   WBC 3.7 (L) 10/01/2019   HGB 12.9 10/01/2019   PLT 194 10/01/2019   NA 141 10/01/2019   K 4.3 10/01/2019   CL 103 10/01/2019   CO2 31 10/01/2019   GLUCOSE 86 10/01/2019   BUN 14 10/01/2019   CREATININE 0.89 10/01/2019   BILITOT 0.6 10/01/2019   ALKPHOS 59 08/05/2016   AST 14 10/01/2019   ALT 13 10/01/2019   PROT 7.4 10/01/2019   ALBUMIN 4.4 08/05/2016   CALCIUM 9.9 10/01/2019   GFRAA 82 10/01/2019    Speciality Comments: PLaquenil eye exam normal on 12/14/18 WNL per [facility]   Procedures:  No  procedures performed Allergies: Patient has no known allergies.   Assessment / Plan:     Visit Diagnoses: Autoimmune disease (HCC) -  +ANA, +RF, +RO, +LA, +ACE. h/o sicca symptoms, malar rash, fatigue, neutropenia - Plan: hydroxychloroquine (PLAQUENIL) 200 MG tablet, COMPLETE METABOLIC PANEL WITH GFR, CBC with Differential/Platelet, Urinalysis, Routine w reflex microscopic, Anti-DNA antibody, double-stranded, C3 and C4, Sedimentation rate  High risk medication use - Plaquenil 200 mg 1 tablet daily. PLQ eye exam: 12/14/18.  Her Plaquenil eye exam in June 2021 got rescheduled to October 2021.  CBC and CMP were drawn on 09/08/2019.  White blood cell count was 3.4 at that time and was rechecked on 10/01/2019 was 3.7.  We will recheck CBC and CMP today to monitor for drug toxicity.- Plan: COMPLETE METABOLIC PANEL WITH GFR, CBC with Differential/Platelet She received the 10/03/2019 Covid vaccination.  The booster has not been approved yet.  She was advised to notify [employer] if she develops a COVID-19 infection  in order to receive the antibody infusion.  She was encouraged to continue to wear a mask, social distance, and practice good hand hygiene.  She voiced understanding.  Primary osteoarthritis of both hands: She has PIP and DIP thickening consistent with osteoarthritis of both hands.  No tenderness or inflammation was noted.  She has complete fist formation bilaterally.  Psoriasis: She has no active psoriasis at this time.  H/O rosacea: She has bouts of rosacea on her face.  History of vitamin D deficiency: Vitamin D was 47 on 04/07/17.   Coccygodynia: Resolved.  History of hypertension    Orders: Orders Placed This Encounter  Procedures  . COMPLETE METABOLIC PANEL WITH GFR  . CBC with Differential/Platelet  . Urinalysis, Routine w reflex microscopic  . Anti-DNA antibody, double-stranded  . C3 and C4  . Sedimentation rate   Meds ordered this encounter  Medications  .  hydroxychloroquine (PLAQUENIL) 200 MG tablet    Sig: Take 1 tablet (200 mg total) by mouth daily.    Dispense:  90 tablet    Refill:  0    Follow-Up Instructions: Return in about 5 months (around 07/11/2020) for Autoimmune Disease, Osteoarthritis.   Sherron Ales, PA-C  I examined and evaluated the patient with Sherron Ales PA.  She had no synovitis on my examination.  She has some malar rash.  She continues to have some arthralgias.  She has been tolerating Plaquenil well.  She has not been using sunscreen.  Importance of using sunscreen on a regular basis was emphasized.  The plan of care was discussed as noted above.  Pollyann Savoy, MD  Note - This record has been created using Animal nutritionist.  Chart creation errors have been sought, but may not always  have been located. Such creation errors do not reflect on  the standard of medical care.

## 2020-02-09 ENCOUNTER — Other Ambulatory Visit: Payer: Self-pay

## 2020-02-09 ENCOUNTER — Encounter: Payer: Self-pay | Admitting: Rheumatology

## 2020-02-09 ENCOUNTER — Ambulatory Visit (INDEPENDENT_AMBULATORY_CARE_PROVIDER_SITE_OTHER): Payer: 59 | Admitting: Rheumatology

## 2020-02-09 VITALS — BP 164/89 | HR 60 | Resp 13 | Ht 67.0 in | Wt 163.4 lb

## 2020-02-09 DIAGNOSIS — Z8679 Personal history of other diseases of the circulatory system: Secondary | ICD-10-CM

## 2020-02-09 DIAGNOSIS — Z79899 Other long term (current) drug therapy: Secondary | ICD-10-CM | POA: Diagnosis not present

## 2020-02-09 DIAGNOSIS — Z8639 Personal history of other endocrine, nutritional and metabolic disease: Secondary | ICD-10-CM

## 2020-02-09 DIAGNOSIS — L409 Psoriasis, unspecified: Secondary | ICD-10-CM

## 2020-02-09 DIAGNOSIS — M19042 Primary osteoarthritis, left hand: Secondary | ICD-10-CM

## 2020-02-09 DIAGNOSIS — Z872 Personal history of diseases of the skin and subcutaneous tissue: Secondary | ICD-10-CM

## 2020-02-09 DIAGNOSIS — M19041 Primary osteoarthritis, right hand: Secondary | ICD-10-CM

## 2020-02-09 DIAGNOSIS — M359 Systemic involvement of connective tissue, unspecified: Secondary | ICD-10-CM

## 2020-02-09 DIAGNOSIS — M533 Sacrococcygeal disorders, not elsewhere classified: Secondary | ICD-10-CM

## 2020-02-09 MED ORDER — HYDROXYCHLOROQUINE SULFATE 200 MG PO TABS: 200.0000 mg | ORAL_TABLET | Freq: Every day | ORAL | 0 refills | Status: DC

## 2020-02-09 NOTE — Patient Instructions (Signed)

## 2020-02-10 LAB — COMPLETE METABOLIC PANEL WITH GFR
AG Ratio: 1.6 (calc) (ref 1.0–2.5)
ALT: 13 U/L (ref 6–29)
AST: 17 U/L (ref 10–35)
Albumin: 4.4 g/dL (ref 3.6–5.1)
Alkaline phosphatase (APISO): 55 U/L (ref 37–153)
BUN: 16 mg/dL (ref 7–25)
CO2: 25 mmol/L (ref 20–32)
Calcium: 9.3 mg/dL (ref 8.6–10.4)
Chloride: 104 mmol/L (ref 98–110)
Creat: 0.81 mg/dL (ref 0.50–0.99)
GFR, Est African American: 91 mL/min/{1.73_m2} (ref >=60)
GFR, Est Non African American: 78 mL/min/{1.73_m2} (ref >=60)
Globulin: 2.8 g/dL (calc) (ref 1.9–3.7)
Glucose, Bld: 94 mg/dL (ref 65–99)
Potassium: 4.2 mmol/L (ref 3.5–5.3)
Sodium: 139 mmol/L (ref 135–146)
Total Bilirubin: 0.8 mg/dL (ref 0.2–1.2)
Total Protein: 7.2 g/dL (ref 6.1–8.1)

## 2020-02-10 LAB — URINALYSIS, ROUTINE W REFLEX MICROSCOPIC
Bilirubin Urine: NEGATIVE
Glucose, UA: NEGATIVE
Hgb urine dipstick: NEGATIVE
Ketones, ur: NEGATIVE
Leukocytes,Ua: NEGATIVE
Nitrite: NEGATIVE
Protein, ur: NEGATIVE
Specific Gravity, Urine: 1.015 (ref 1.001–1.03)
pH: 7.5 (ref 5.0–8.0)

## 2020-02-10 LAB — CBC WITH DIFFERENTIAL/PLATELET
Absolute Monocytes: 307 cells/uL (ref 200–950)
Basophils Absolute: 41 cells/uL (ref 0–200)
Basophils Relative: 1.1 %
Eosinophils Absolute: 118 cells/uL (ref 15–500)
Eosinophils Relative: 3.2 %
HCT: 38.1 % (ref 35.0–45.0)
Hemoglobin: 12.7 g/dL (ref 11.7–15.5)
Lymphs Abs: 825 cells/uL — ABNORMAL LOW (ref 850–3900)
MCH: 30 pg (ref 27.0–33.0)
MCHC: 33.3 g/dL (ref 32.0–36.0)
MCV: 90.1 fL (ref 80.0–100.0)
MPV: 11 fL (ref 7.5–12.5)
Monocytes Relative: 8.3 %
Neutro Abs: 2409 cells/uL (ref 1500–7800)
Neutrophils Relative %: 65.1 %
Platelets: 217 10*3/uL (ref 140–400)
RBC: 4.23 10*6/uL (ref 3.80–5.10)
RDW: 12.9 % (ref 11.0–15.0)
Total Lymphocyte: 22.3 %
WBC: 3.7 10*3/uL — ABNORMAL LOW (ref 3.8–10.8)

## 2020-02-10 LAB — SEDIMENTATION RATE: Sed Rate: 19 mm/h (ref 0–30)

## 2020-02-10 LAB — C3 AND C4
C3 Complement: 109 mg/dL (ref 83–193)
C4 Complement: 26 mg/dL (ref 15–57)

## 2020-02-10 LAB — ANTI-DNA ANTIBODY, DOUBLE-STRANDED: ds DNA Ab: <1 IU/mL

## 2020-02-10 NOTE — Progress Notes (Signed)
CMP WNL.  WBC count is borderline low-3.7-stable.  Absolute lymphocytes are borderline low. Complements WNL.  ESR WNL.

## 2020-02-10 NOTE — Progress Notes (Signed)
DsDNA is negative.  Lab work is not consistent with a flare.  No change in therapy recommended.

## 2020-06-27 ENCOUNTER — Other Ambulatory Visit: Payer: Self-pay

## 2020-06-27 DIAGNOSIS — M359 Systemic involvement of connective tissue, unspecified: Secondary | ICD-10-CM

## 2020-06-28 ENCOUNTER — Other Ambulatory Visit: Payer: Self-pay | Admitting: *Deleted

## 2020-06-28 DIAGNOSIS — M359 Systemic involvement of connective tissue, unspecified: Secondary | ICD-10-CM

## 2020-06-28 MED ORDER — HYDROXYCHLOROQUINE SULFATE 200 MG PO TABS: 200.0000 mg | ORAL_TABLET | Freq: Every day | ORAL | 0 refills | Status: DC

## 2020-06-28 NOTE — Telephone Encounter (Signed)
Documented in patients chart.

## 2020-06-28 NOTE — Telephone Encounter (Signed)
Last Visit: 02/09/2020 Next Visit: 07/17/2020 Labs: 02/09/2020, CMP WNL. WBC count is borderline low-3.7-stable. Absolute lymphocytes are borderline low. Complements WNL. ESR WNL Eye exam: 12/14/2018. LMOM for patient to have updated PLQ eye exam performed  Current Dose per office note 02/09/2020, Plaquenil 200 mg 1 tablet daily [id number] disease   Okay to refill Plaquenil?

## 2020-07-04 NOTE — Progress Notes (Deleted)
Office Visit Note  Patient: Suzanne [name]             Date of Birth: 12/05/1960           MRN: [id number]             PCP: Marylen Ponto, MD Referring: Marylen Ponto, MD Visit Date: 07/17/2020 Occupation: @GUAROCC @  Subjective:  No chief complaint on file.   History of Present Illness: Suzanne [name] is a 60 y.o. female ***   Activities of Daily Living:  Patient reports morning stiffness for *** {minute/hour:19697}.   Patient {ACTIONS;DENIES/REPORTS:21021675::"Denies"} nocturnal pain.  Difficulty dressing/grooming: {ACTIONS;DENIES/REPORTS:21021675::"Denies"} Difficulty climbing stairs: {ACTIONS;DENIES/REPORTS:21021675::"Denies"} Difficulty getting out of chair: {ACTIONS;DENIES/REPORTS:21021675::"Denies"} Difficulty using hands for taps, buttons, cutlery, and/or writing: {ACTIONS;DENIES/REPORTS:21021675::"Denies"}  No Rheumatology ROS completed.   PMFS History:  Patient Active Problem List   Diagnosis Date Noted  . Autoimmune disease (HCC) 08/26/2016  . High risk medication use 08/26/2016  . Psoriasis 08/26/2016  . Primary osteoarthritis of both hands 08/26/2016  . History of Sjogren's disease (HCC) 08/26/2016  . Pain, joint, knee, left 08/26/2016  . History of hypertension 08/26/2016  . H/O rosacea 08/26/2016    Past Medical History:  Diagnosis Date  . Hypertension     Family History  Problem Relation Age of Onset  . Hypertension Mother   . Stroke Mother   . Hypercholesterolemia Mother   . Cancer Father   . Clotting disorder Daughter   . Hashimoto's thyroiditis Daughter   . Rheum arthritis Daughter    No past surgical history on file. Social History   Social History Narrative  . Not on file   Immunization History  Administered Date(s) Administered  . Influenza,inj,Quad PF,6+ Mos 03/23/2017     Objective: Vital Signs: There were no vitals taken for this visit.   Physical Exam   Musculoskeletal Exam: ***  CDAI Exam: CDAI Score:  -- Patient Global: --; Provider Global: -- Swollen: --; Tender: -- Joint Exam 07/17/2020   No joint exam has been documented for this visit   There is currently no information documented on the homunculus. Go to the Rheumatology activity and complete the homunculus joint exam.  Investigation: No additional findings.  Imaging: No results found.  Recent Labs: Lab Results  Component Value Date   WBC 3.7 (L) 02/09/2020   HGB 12.7 02/09/2020   PLT 217 02/09/2020   NA 139 02/09/2020   K 4.2 02/09/2020   CL 104 02/09/2020   CO2 25 02/09/2020   GLUCOSE 94 02/09/2020   BUN 16 02/09/2020   CREATININE 0.81 02/09/2020   BILITOT 0.8 02/09/2020   ALKPHOS 59 08/05/2016   AST 17 02/09/2020   ALT 13 02/09/2020   PROT 7.2 02/09/2020   ALBUMIN 4.4 08/05/2016   CALCIUM 9.3 02/09/2020   GFRAA 91 02/09/2020    Speciality Comments: PLaquenil eye exam normal on 03/20/2020 WNL @ 05/20/2020 Follow up 1 year  Procedures:  No procedures performed Allergies: Patient has no known allergies.   Assessment / Plan:     Visit Diagnoses: No diagnosis found.  Orders: No orders of the defined types were placed in this encounter.  No orders of the defined types were placed in this encounter.   Face-to-face time spent with patient was *** minutes. Greater than 50% of time was spent in counseling and coordination of care.  Follow-Up Instructions: No follow-ups on file.   [employer], CMA  Note - This record has been created using  Dragon software.  Chart creation errors have been sought, but may not always  have been located. Such creation errors do not reflect on  the standard of medical care. 

## 2020-07-14 NOTE — Progress Notes (Signed)
Office Visit Note  Patient: Suzanne [name]             Date of Birth: 18-Nov-1960           MRN: [id number]             PCP: Marylen Ponto, MD Referring: Marylen Ponto, MD Visit Date: 07/27/2020 Occupation: @GUAROCC @  Subjective:  Medication monitoring.   History of Present Illness: Suzanne [name] is a 60 y.o. female with history of autoimmune disease and osteoarthritis.  She states she continues to have dry mouth and dry eyes.  She has stiffness in her hands and her knee joints.  She has not noticed any joint swelling.  She has been tolerating Plaquenil well.  She has been using over-the-counter products for sicca symptoms.  She states that the pain from coccygodynia has resolved.  Is occasional psoriasis rash which responds to topical agents.  She states her psoriasis is usually worse during the winter months.  Activities of Daily Living:  Patient reports morning stiffness for 20 minutes.   Patient Denies nocturnal pain.  Difficulty dressing/grooming: Denies Difficulty climbing stairs: Denies Difficulty getting out of chair: Denies Difficulty using hands for taps, buttons, cutlery, and/or writing: Denies  Review of Systems  Constitutional: Positive for fatigue.  HENT: Positive for nose dryness. Negative for mouth sores and mouth dryness.   Eyes: Positive for dryness. Negative for pain and itching.  Respiratory: Negative for shortness of breath and difficulty breathing.   Cardiovascular: Negative for chest pain and palpitations.  Gastrointestinal: Positive for constipation and diarrhea. Negative for blood in stool.  Endocrine: Negative for increased urination.  Genitourinary: Negative for difficulty urinating.  Musculoskeletal: Positive for arthralgias, joint pain, myalgias, morning stiffness, muscle tenderness and myalgias. Negative for joint swelling.  Skin: Negative for color change, rash and redness.  Allergic/Immunologic: Negative for susceptible to infections.   Neurological: Positive for weakness. Negative for dizziness, numbness, headaches and memory loss.  Hematological: Negative for bruising/bleeding tendency.  Psychiatric/Behavioral: Negative for confusion.    PMFS History:  Patient Active Problem List   Diagnosis Date Noted  . Autoimmune disease (HCC) 08/26/2016  . High risk medication use 08/26/2016  . Psoriasis 08/26/2016  . Primary osteoarthritis of both hands 08/26/2016  . History of Sjogren's disease (HCC) 08/26/2016  . Pain, joint, knee, left 08/26/2016  . History of hypertension 08/26/2016  . H/O rosacea 08/26/2016    Past Medical History:  Diagnosis Date  . Hypertension     Family History  Problem Relation Age of Onset  . Hypertension Mother   . Stroke Mother   . Hypercholesterolemia Mother   . Cancer Father   . Clotting disorder Daughter   . Hashimoto's thyroiditis Daughter   . Rheum arthritis Daughter    History reviewed. No pertinent surgical history. Social History   Social History Narrative  . Not on file   Immunization History  Administered Date(s) Administered  . Influenza,inj,Quad PF,6+ Mos 03/23/2017  . Janssen (J&J) SARS-COV-2 Vaccination 09/22/2019  . PFIZER(Purple Top)SARS-COV-2 Vaccination 04/23/2020     Objective: Vital Signs: BP 140/75 (BP Location: Left Arm, Patient Position: Sitting, Cuff Size: Normal)   Pulse 60   Resp 15   Ht 5\' 7"  (1.702 m)   Wt 165 lb (74.8 kg)   BMI 25.84 kg/m    Physical Exam Vitals and nursing note reviewed.  Constitutional:      Appearance: She is well-developed and well-nourished.  HENT:     Head: Normocephalic  and atraumatic.  Eyes:     Extraocular Movements: EOM normal.     Conjunctiva/sclera: Conjunctivae normal.  Cardiovascular:     Rate and Rhythm: Normal rate and regular rhythm.     Pulses: Intact distal pulses.     Heart sounds: Normal heart sounds.  Pulmonary:     Effort: Pulmonary effort is normal.     Breath sounds: Normal breath sounds.   Abdominal:     General: Bowel sounds are normal.     Palpations: Abdomen is soft.  Musculoskeletal:     Cervical back: Normal range of motion.  Lymphadenopathy:     Cervical: No cervical adenopathy.  Skin:    General: Skin is warm and dry.     Capillary Refill: Capillary refill takes less than 2 seconds.  Neurological:     Mental Status: She is alert and oriented to person, place, and time.  Psychiatric:        Mood and Affect: Mood and affect normal.        Behavior: Behavior normal.      Musculoskeletal Exam: C-spine was in good range of motion.  Shoulder joints, elbow joints, wrist joints, MCPs PIPs and DIPs with good range of motion with no synovitis.  She is some DIP and PIP thickening.  Hip joints, knee joints, ankles, MTPs and PIPs with good range of motion.  She has some discomfort over her left first MTP joint.  CDAI Exam: CDAI Score: -- Patient Global: --; Provider Global: -- Swollen: --; Tender: -- Joint Exam 07/27/2020   No joint exam has been documented for this visit   There is currently no information documented on the homunculus. Go to the Rheumatology activity and complete the homunculus joint exam.  Investigation: No additional findings.  Imaging: No results found.  Recent Labs: Lab Results  Component Value Date   WBC 3.7 (L) 02/09/2020   HGB 12.7 02/09/2020   PLT 217 02/09/2020   NA 139 02/09/2020   K 4.2 02/09/2020   CL 104 02/09/2020   CO2 25 02/09/2020   GLUCOSE 94 02/09/2020   BUN 16 02/09/2020   CREATININE 0.81 02/09/2020   BILITOT 0.8 02/09/2020   ALKPHOS 59 08/05/2016   AST 17 02/09/2020   ALT 13 02/09/2020   PROT 7.2 02/09/2020   ALBUMIN 4.4 08/05/2016   CALCIUM 9.3 02/09/2020   GFRAA 91 02/09/2020    Speciality Comments: PLaquenil eye exam normal on 03/20/2020 WNL @ [employer] Follow up 1 year  Procedures:  No procedures performed Allergies: Patient has no known allergies.   Assessment / Plan:     Visit  Diagnoses: Autoimmune disease (HCC) - +ANA, +RF, +RO, +LA, +ACE. h/o sicca symptoms, malar rash, fatigue, neutropenia -she has been taking Plaquenil 1 tablet p.o. daily.  She denies any episodes of malar rash or photosensitivity.  She denies Raynaud's phenomenon.  She states she continues to have some sicca symptoms.  Over-the-counter products were discussed.  I will obtain labs today to monitor the disease process.  Prescription refill for Plaquenil was given.  Plan: Urinalysis, Routine w reflex microscopic, Anti-DNA antibody, double-stranded, C3 and C4, Sedimentation rate, hydroxychloroquine (PLAQUENIL) 200 MG tablet  High risk medication use - Plaquenil 200 mg 1 tablet daily. PLQ eye exam: 03/20/2020 - Plan: CBC with Differential/Platelet, COMPLETE METABOLIC PANEL WITH GFR  Primary osteoarthritis of both hands-joint protection muscle strengthening was discussed.  Primary osteoarthritis of both feet-she has some MTP discomfort.  Proper fitting shoes and arch support were  discussed.  Psoriasis-she has intermittent rash.  The rash is worse during the winter months.  She is using topical agents which has been helpful.  H/O rosacea  History of vitamin D deficiency  History of hypertension-blood pressure is mildly elevated.  Have advised her to monitor blood pressure closely and discuss with the values with her PCP.  Orders: Orders Placed This Encounter  Procedures  . CBC with Differential/Platelet  . COMPLETE METABOLIC PANEL WITH GFR  . Urinalysis, Routine w reflex microscopic  . Anti-DNA antibody, double-stranded  . C3 and C4  . Sedimentation rate   Meds ordered this encounter  Medications  . hydroxychloroquine (PLAQUENIL) 200 MG tablet    Sig: Take 1 tablet (200 mg total) by mouth daily.    Dispense:  90 tablet    Refill:  0      Follow-Up Instructions: Return in about 5 months (around 12/24/2020).   Pollyann Savoy, MD  Note - This record has been created using [employer].  Chart creation errors have been sought, but may not always  have been located. Such creation errors do not reflect on  the standard of medical care.

## 2020-07-17 ENCOUNTER — Ambulatory Visit: Payer: 59 | Admitting: Rheumatology

## 2020-07-17 DIAGNOSIS — Z8679 Personal history of other diseases of the circulatory system: Secondary | ICD-10-CM

## 2020-07-17 DIAGNOSIS — Z872 Personal history of diseases of the skin and subcutaneous tissue: Secondary | ICD-10-CM

## 2020-07-17 DIAGNOSIS — Z8639 Personal history of other endocrine, nutritional and metabolic disease: Secondary | ICD-10-CM

## 2020-07-17 DIAGNOSIS — M19041 Primary osteoarthritis, right hand: Secondary | ICD-10-CM

## 2020-07-17 DIAGNOSIS — M533 Sacrococcygeal disorders, not elsewhere classified: Secondary | ICD-10-CM

## 2020-07-17 DIAGNOSIS — L409 Psoriasis, unspecified: Secondary | ICD-10-CM

## 2020-07-17 DIAGNOSIS — M359 Systemic involvement of connective tissue, unspecified: Secondary | ICD-10-CM

## 2020-07-17 DIAGNOSIS — Z79899 Other long term (current) drug therapy: Secondary | ICD-10-CM

## 2020-07-27 ENCOUNTER — Encounter: Payer: Self-pay | Admitting: Rheumatology

## 2020-07-27 ENCOUNTER — Other Ambulatory Visit: Payer: Self-pay

## 2020-07-27 ENCOUNTER — Ambulatory Visit (INDEPENDENT_AMBULATORY_CARE_PROVIDER_SITE_OTHER): Payer: 59 | Admitting: Rheumatology

## 2020-07-27 VITALS — BP 140/75 | HR 60 | Resp 15 | Ht 67.0 in | Wt 165.0 lb

## 2020-07-27 DIAGNOSIS — M533 Sacrococcygeal disorders, not elsewhere classified: Secondary | ICD-10-CM

## 2020-07-27 DIAGNOSIS — M19041 Primary osteoarthritis, right hand: Secondary | ICD-10-CM

## 2020-07-27 DIAGNOSIS — L409 Psoriasis, unspecified: Secondary | ICD-10-CM | POA: Diagnosis not present

## 2020-07-27 DIAGNOSIS — Z8639 Personal history of other endocrine, nutritional and metabolic disease: Secondary | ICD-10-CM

## 2020-07-27 DIAGNOSIS — M359 Systemic involvement of connective tissue, unspecified: Secondary | ICD-10-CM

## 2020-07-27 DIAGNOSIS — M19071 Primary osteoarthritis, right ankle and foot: Secondary | ICD-10-CM

## 2020-07-27 DIAGNOSIS — Z79899 Other long term (current) drug therapy: Secondary | ICD-10-CM | POA: Diagnosis not present

## 2020-07-27 DIAGNOSIS — Z8679 Personal history of other diseases of the circulatory system: Secondary | ICD-10-CM

## 2020-07-27 DIAGNOSIS — M19072 Primary osteoarthritis, left ankle and foot: Secondary | ICD-10-CM

## 2020-07-27 DIAGNOSIS — Z872 Personal history of diseases of the skin and subcutaneous tissue: Secondary | ICD-10-CM

## 2020-07-27 DIAGNOSIS — M19042 Primary osteoarthritis, left hand: Secondary | ICD-10-CM

## 2020-07-27 MED ORDER — HYDROXYCHLOROQUINE SULFATE 200 MG PO TABS: 200.0000 mg | ORAL_TABLET | Freq: Every day | ORAL | 0 refills | Status: DC

## 2020-07-27 NOTE — Patient Instructions (Addendum)
Standing Labs We placed an order today for your standing lab work.   Please have your standing labs drawn in July 2022  If possible, please have your labs drawn 2 weeks prior to your appointment so that the provider can discuss your results at your appointment.  We have open lab daily Monday through Thursday from 1:30-4:30 PM and Friday from 1:30-4:00 PM at the office of Dr. Pollyann Savoy, [facility].   Please be advised, all patients with office appointments requiring lab work will take precedents over walk-in lab work.  If possible, please come for your lab work on Monday and Friday afternoons, as you may experience shorter wait times. The office is located at [address], [place], [place] [address] No appointment is necessary.   Labs are drawn by [facility]. Please bring your co-pay at the time of your lab draw.  You may receive a bill from [facility] for your lab work.  If you wish to have your labs drawn at another location, please call the office 24 hours in advance to send orders.  If you have any questions regarding directions or hours of operation,  please call [phone].   As a reminder, please drink plenty of water prior to coming for your lab work. Thanks!

## 2020-07-28 LAB — C3 AND C4
C3 Complement: 109 mg/dL (ref 83–193)
C4 Complement: 30 mg/dL (ref 15–57)

## 2020-07-28 LAB — ANTI-DNA ANTIBODY, DOUBLE-STRANDED: ds DNA Ab: <1 IU/mL

## 2020-07-28 LAB — SEDIMENTATION RATE: Sed Rate: 17 mm/h (ref 0–30)

## 2020-07-28 LAB — CBC WITH DIFFERENTIAL/PLATELET
Absolute Monocytes: 353 cells/uL (ref 200–950)
Basophils Absolute: 30 cells/uL (ref 0–200)
Basophils Relative: 0.7 %
Eosinophils Absolute: 99 cells/uL (ref 15–500)
Eosinophils Relative: 2.3 %
HCT: 35.7 % (ref 35.0–45.0)
Hemoglobin: 12.2 g/dL (ref 11.7–15.5)
Lymphs Abs: 1144 cells/uL (ref 850–3900)
MCH: 31.1 pg (ref 27.0–33.0)
MCHC: 34.2 g/dL (ref 32.0–36.0)
MCV: 91.1 fL (ref 80.0–100.0)
MPV: 11.4 fL (ref 7.5–12.5)
Monocytes Relative: 8.2 %
Neutro Abs: 2675 cells/uL (ref 1500–7800)
Neutrophils Relative %: 62.2 %
Platelets: 240 10*3/uL (ref 140–400)
RBC: 3.92 10*6/uL (ref 3.80–5.10)
RDW: 12.3 % (ref 11.0–15.0)
Total Lymphocyte: 26.6 %
WBC: 4.3 10*3/uL (ref 3.8–10.8)

## 2020-07-28 LAB — URINALYSIS, ROUTINE W REFLEX MICROSCOPIC
Bacteria, UA: NONE SEEN /HPF
Bilirubin Urine: NEGATIVE
Glucose, UA: NEGATIVE
Hgb urine dipstick: NEGATIVE
Hyaline Cast: NONE SEEN /LPF
Ketones, ur: NEGATIVE
Nitrite: NEGATIVE
Protein, ur: NEGATIVE
RBC / HPF: NONE SEEN /HPF (ref 0–2)
Specific Gravity, Urine: 1.009 (ref 1.001–1.03)
pH: 5.5 (ref 5.0–8.0)

## 2020-07-28 LAB — COMPLETE METABOLIC PANEL WITH GFR
AG Ratio: 1.6 (calc) (ref 1.0–2.5)
ALT: 15 U/L (ref 6–29)
AST: 15 U/L (ref 10–35)
Albumin: 4.5 g/dL (ref 3.6–5.1)
Alkaline phosphatase (APISO): 60 U/L (ref 37–153)
BUN: 14 mg/dL (ref 7–25)
CO2: 31 mmol/L (ref 20–32)
Calcium: 10 mg/dL (ref 8.6–10.4)
Chloride: 102 mmol/L (ref 98–110)
Creat: 0.86 mg/dL (ref 0.50–0.99)
GFR, Est African American: 85 mL/min/{1.73_m2} (ref >=60)
GFR, Est Non African American: 73 mL/min/{1.73_m2} (ref >=60)
Globulin: 2.9 g/dL (calc) (ref 1.9–3.7)
Glucose, Bld: 86 mg/dL (ref 65–99)
Potassium: 4.1 mmol/L (ref 3.5–5.3)
Sodium: 139 mmol/L (ref 135–146)
Total Bilirubin: 0.5 mg/dL (ref 0.2–1.2)
Total Protein: 7.4 g/dL (ref 6.1–8.1)

## 2020-10-16 ENCOUNTER — Other Ambulatory Visit: Payer: Self-pay | Admitting: Rheumatology

## 2020-10-16 DIAGNOSIS — M359 Systemic involvement of connective tissue, unspecified: Secondary | ICD-10-CM

## 2020-10-17 MED ORDER — HYDROXYCHLOROQUINE SULFATE 200 MG PO TABS: 200.0000 mg | ORAL_TABLET | Freq: Every day | ORAL | 0 refills | Status: DC

## 2020-10-17 NOTE — Telephone Encounter (Signed)
Last Visit: 07/27/2020 Next Visit: 01/02/2021 Labs: 07/27/2020, CBC and CMP WNL. UA revealed trace leukocytes. Negatvie for bacteria and nitrites. Complements and ESR WNL. dsDNA is negative.  Eye exam:  03/20/2020  Current Dose per office note 07/27/2020, Plaquenil 200 mg 1 tablet daily  DX: Autoimmune disease   Last Fill: 07/27/2020  Okay to refill Plaquenil?

## 2020-11-23 ENCOUNTER — Encounter: Payer: Self-pay | Admitting: Rheumatology

## 2020-12-19 NOTE — Progress Notes (Signed)
Office Visit Note  Patient: Suzanne [name]             Date of Birth: 1960/09/23           MRN: [id number]             PCP: Marylen Ponto, MD Referring: Marylen Ponto, MD Visit Date: 01/02/2021 Occupation: @GUAROCC @  Subjective:  Joint stiffness   History of Present Illness: Suzanne [name] is a 60 y.o. female with a history of autoimmune disease and osteoarthritis.  She continues to have dry nose and dry eye symptoms.  She states that she keeps the facial rash for which she is uses topical agents.  She has some stiffness in her joints due to underlying osteoarthritis.  She denies any joint swelling.  Activities of Daily Living:  Patient reports morning stiffness for 20-30 minutes.   Patient Denies nocturnal pain.  Difficulty dressing/grooming: Denies Difficulty climbing stairs: Denies Difficulty getting out of chair: Denies Difficulty using hands for taps, buttons, cutlery, and/or writing: Denies  Review of Systems  Constitutional:  Positive for fatigue.  HENT:  Positive for nose dryness. Negative for mouth sores and mouth dryness.   Eyes:  Positive for dryness. Negative for pain and itching.  Respiratory:  Negative for shortness of breath and difficulty breathing.   Cardiovascular:  Negative for chest pain and palpitations.  Gastrointestinal:  Negative for blood in stool, constipation and diarrhea.  Endocrine: Negative for increased urination.  Genitourinary:  Negative for difficulty urinating.  Musculoskeletal:  Positive for joint pain, joint pain, myalgias, morning stiffness, muscle tenderness and myalgias. Negative for joint swelling.  Skin:  Positive for rash. Negative for color change and hair loss.  Allergic/Immunologic: Negative for susceptible to infections.  Neurological:  Positive for weakness. Negative for dizziness, numbness, headaches and memory loss.  Hematological:  Negative for bruising/bleeding tendency.  Psychiatric/Behavioral:  Negative for  confusion.    PMFS History:  Patient Active Problem List   Diagnosis Date Noted   Autoimmune disease (HCC) 08/26/2016   High risk medication use 08/26/2016   Psoriasis 08/26/2016   Primary osteoarthritis of both hands 08/26/2016   History of Sjogren's disease (HCC) 08/26/2016   Pain, joint, knee, left 08/26/2016   History of hypertension 08/26/2016   H/O rosacea 08/26/2016    Past Medical History:  Diagnosis Date   Hypertension     Family History  Problem Relation Age of Onset   Hypertension Mother    Stroke Mother    Hypercholesterolemia Mother    Cancer Father    Clotting disorder Daughter    Hashimoto's thyroiditis Daughter    Rheum arthritis Daughter    History reviewed. No pertinent surgical history. Social History   Social History Narrative   Not on file   Immunization History  Administered Date(s) Administered   Influenza,inj,Quad PF,6+ Mos 03/23/2017   Janssen (J&J) SARS-COV-2 Vaccination 09/22/2019   PFIZER(Purple Top)SARS-COV-2 Vaccination 04/23/2020     Objective: Vital Signs: BP (!) 152/81 (BP Location: Left Arm, Patient Position: Sitting, Cuff Size: Normal)   Pulse 74   Ht 5\' 7"  (1.702 m)   Wt 167 lb 12.8 oz (76.1 kg)   BMI 26.28 kg/m    Physical Exam Vitals and nursing note reviewed.  Constitutional:      Appearance: She is well-developed.  HENT:     Head: Normocephalic and atraumatic.  Eyes:     Conjunctiva/sclera: Conjunctivae normal.  Cardiovascular:     Rate and Rhythm: Normal rate and  regular rhythm.     Heart sounds: Normal heart sounds.  Pulmonary:     Effort: Pulmonary effort is normal.     Breath sounds: Normal breath sounds.  Abdominal:     General: Bowel sounds are normal.     Palpations: Abdomen is soft.  Musculoskeletal:     Cervical back: Normal range of motion.  Lymphadenopathy:     Cervical: No cervical adenopathy.  Skin:    General: Skin is warm and dry.     Capillary Refill: Capillary refill takes less than 2  seconds.     Comments: Facial erythema was noted.  Neurological:     Mental Status: She is alert and oriented to person, place, and time.  Psychiatric:        Behavior: Behavior normal.     Musculoskeletal Exam: C-spine was in good range of motion.  Shoulder joints, elbow joints, wrist joints with good range of motion with no synovitis.  PIP and DIP thickening was noted with no synovitis.  Hip joints, knee joints with good range of motion.  There was no tenderness over ankles or MTP joints.  CDAI Exam: CDAI Score: -- Patient Global: --; Provider Global: -- Swollen: --; Tender: -- Joint Exam 01/02/2021   No joint exam has been documented for this visit   There is currently no information documented on the homunculus. Go to the Rheumatology activity and complete the homunculus joint exam.  Investigation: No additional findings.  Imaging: No results found.  Recent Labs: Lab Results  Component Value Date   WBC 4.3 07/27/2020   HGB 12.2 07/27/2020   PLT 240 07/27/2020   NA 139 07/27/2020   K 4.1 07/27/2020   CL 102 07/27/2020   CO2 31 07/27/2020   GLUCOSE 86 07/27/2020   BUN 14 07/27/2020   CREATININE 0.86 07/27/2020   BILITOT 0.5 07/27/2020   ALKPHOS 59 08/05/2016   AST 15 07/27/2020   ALT 15 07/27/2020   PROT 7.4 07/27/2020   ALBUMIN 4.4 08/05/2016   CALCIUM 10.0 07/27/2020   GFRAA 85 07/27/2020    Speciality Comments: PLaquenil eye exam normal on 03/20/2020 WNL @ [facility] Follow up 1 year  Procedures:  No procedures performed Allergies: Patient has no known allergies.   Assessment / Plan:     Visit Diagnoses: Autoimmune disease (HCC) - +ANA, +RF, +Ro, +La, +ACE. h/o sicca symptoms, malar rash, fatigue, neutropenia -she continues to have dry nose and dry eye symptoms.  She states symptoms have improved on Plaquenil.  She also has rash on her face for which she uses different agents.  She states recently she was diagnosed with rosacea and was given  antifungal treatment.  She denies any joint inflammation.  Plan: Urinalysis, Routine w reflex microscopic, Sedimentation rate, C3 and C4, Anti-DNA antibody, double-stranded.  I will check autoimmune labs today.  We will call her back when the results are available.  High risk medication use - Plaquenil 200 mg 1 tablet daily. PLQ eye exam: 03/20/2020 - Plan: CBC with Differential/Platelet, COMPLETE METABOLIC PANEL WITH GFR today and then every 5 months.  Primary osteoarthritis of both hands-she continues to have some stiffness in her hands due to underlying osteoarthritis.  Primary osteoarthritis of both feet-she has stiffness in her feet but no synovitis was noted.  Psoriasis - she has intermittent rash.  H/O rosacea  History of vitamin D deficiency-she is currently not taking vitamin D.  History of hypertension-blood pressure is elevated today.  She has been  advised to monitor blood pressure closely and follow-up with her PCP.  Increased risk of heart disease with autoimmune disease was also discussed.  Dietary modifications and exercise was discussed and a handout was placed in the AVS.  Orders: Orders Placed This Encounter  Procedures   CBC with Differential/Platelet   COMPLETE METABOLIC PANEL WITH GFR   Urinalysis, Routine w reflex microscopic   Sedimentation rate   C3 and C4   Anti-DNA antibody, double-stranded   No orders of the defined types were placed in this encounter.    Follow-Up Instructions: No follow-ups on file.   Pollyann Savoy, MD  Note - This record has been created using Animal nutritionist.  Chart creation errors have been sought, but may not always  have been located. Such creation errors do not reflect on  the standard of medical care.

## 2021-01-02 ENCOUNTER — Ambulatory Visit (INDEPENDENT_AMBULATORY_CARE_PROVIDER_SITE_OTHER): Payer: 59 | Admitting: Rheumatology

## 2021-01-02 ENCOUNTER — Other Ambulatory Visit: Payer: Self-pay

## 2021-01-02 ENCOUNTER — Encounter: Payer: Self-pay | Admitting: Rheumatology

## 2021-01-02 VITALS — BP 152/81 | HR 74 | Ht 67.0 in | Wt 167.8 lb

## 2021-01-02 DIAGNOSIS — M19072 Primary osteoarthritis, left ankle and foot: Secondary | ICD-10-CM

## 2021-01-02 DIAGNOSIS — M19071 Primary osteoarthritis, right ankle and foot: Secondary | ICD-10-CM

## 2021-01-02 DIAGNOSIS — M19041 Primary osteoarthritis, right hand: Secondary | ICD-10-CM

## 2021-01-02 DIAGNOSIS — M359 Systemic involvement of connective tissue, unspecified: Secondary | ICD-10-CM

## 2021-01-02 DIAGNOSIS — L409 Psoriasis, unspecified: Secondary | ICD-10-CM

## 2021-01-02 DIAGNOSIS — Z8639 Personal history of other endocrine, nutritional and metabolic disease: Secondary | ICD-10-CM

## 2021-01-02 DIAGNOSIS — M19042 Primary osteoarthritis, left hand: Secondary | ICD-10-CM

## 2021-01-02 DIAGNOSIS — Z79899 Other long term (current) drug therapy: Secondary | ICD-10-CM | POA: Diagnosis not present

## 2021-01-02 DIAGNOSIS — Z872 Personal history of diseases of the skin and subcutaneous tissue: Secondary | ICD-10-CM

## 2021-01-02 DIAGNOSIS — Z8679 Personal history of other diseases of the circulatory system: Secondary | ICD-10-CM

## 2021-01-02 NOTE — Patient Instructions (Signed)
Vaccines You are taking a medication(s) that can suppress your immune system.  The following immunizations are recommended: Flu annually Covid-19  Td/Tdap (tetanus, diphtheria, pertussis) every 10 years Pneumonia (Prevnar 15 then Pneumovax 23 at least 1 year apart.  Alternatively, can take Prevnar 20 without needing additional dose) Shingrix (after age 62): 2 doses from 4 weeks to 6 months apart  Please check with your PCP to make sure you are up to date.   Heart Disease Prevention   Your inflammatory disease increases your risk of heart disease which includes heart attack, stroke, atrial fibrillation (irregular heartbeats), high blood pressure, heart failure and atherosclerosis (plaque in the arteries).  It is important to reduce your risk by:   Keep blood pressure, cholesterol, and blood sugar at healthy levels   Smoking Cessation   Maintain a healthy weight  BMI 20-25   Eat a healthy diet  Plenty of fresh fruit, vegetables, and whole grains  Limit saturated fats, foods high in sodium, and added sugars  DASH and Mediterranean diet   Increase physical activity  Recommend moderate physically activity for 150 minutes per week/ 30 minutes a day for five days a week These can be broken up into three separate ten-minute sessions during the day.   Reduce Stress  Meditation, slow breathing exercises, yoga, coloring books  Dental visits twice a year   

## 2021-01-03 LAB — COMPLETE METABOLIC PANEL WITH GFR
AG Ratio: 1.5 (calc) (ref 1.0–2.5)
ALT: 16 U/L (ref 6–29)
AST: 19 U/L (ref 10–35)
Albumin: 4.4 g/dL (ref 3.6–5.1)
Alkaline phosphatase (APISO): 70 U/L (ref 37–153)
BUN: 16 mg/dL (ref 7–25)
CO2: 29 mmol/L (ref 20–32)
Calcium: 9.3 mg/dL (ref 8.6–10.4)
Chloride: 102 mmol/L (ref 98–110)
Creat: 1.01 mg/dL (ref 0.50–1.05)
Globulin: 2.9 g/dL (calc) (ref 1.9–3.7)
Glucose, Bld: 95 mg/dL (ref 65–99)
Potassium: 3.6 mmol/L (ref 3.5–5.3)
Sodium: 138 mmol/L (ref 135–146)
Total Bilirubin: 0.5 mg/dL (ref 0.2–1.2)
Total Protein: 7.3 g/dL (ref 6.1–8.1)
eGFR: 63 mL/min/{1.73_m2} (ref >=60)

## 2021-01-03 LAB — ANTI-DNA ANTIBODY, DOUBLE-STRANDED: ds DNA Ab: <1 IU/mL

## 2021-01-03 LAB — CBC WITH DIFFERENTIAL/PLATELET
Absolute Monocytes: 329 cells/uL (ref 200–950)
Basophils Absolute: 11 cells/uL (ref 0–200)
Basophils Relative: 0.4 %
Eosinophils Absolute: 41 cells/uL (ref 15–500)
Eosinophils Relative: 1.5 %
HCT: 35.1 % (ref 35.0–45.0)
Hemoglobin: 11.9 g/dL (ref 11.7–15.5)
Lymphs Abs: 537 cells/uL — ABNORMAL LOW (ref 850–3900)
MCH: 31 pg (ref 27.0–33.0)
MCHC: 33.9 g/dL (ref 32.0–36.0)
MCV: 91.4 fL (ref 80.0–100.0)
MPV: 10.6 fL (ref 7.5–12.5)
Monocytes Relative: 12.2 %
Neutro Abs: 1782 cells/uL (ref 1500–7800)
Neutrophils Relative %: 66 %
Platelets: 154 10*3/uL (ref 140–400)
RBC: 3.84 10*6/uL (ref 3.80–5.10)
RDW: 13 % (ref 11.0–15.0)
Total Lymphocyte: 19.9 %
WBC: 2.7 10*3/uL — ABNORMAL LOW (ref 3.8–10.8)

## 2021-01-03 LAB — URINALYSIS, ROUTINE W REFLEX MICROSCOPIC
Bilirubin Urine: NEGATIVE
Glucose, UA: NEGATIVE
Hgb urine dipstick: NEGATIVE
Ketones, ur: NEGATIVE
Leukocytes,Ua: NEGATIVE
Nitrite: NEGATIVE
Protein, ur: NEGATIVE
Specific Gravity, Urine: 1.014 (ref 1.001–1.035)
pH: <=5 (ref 5.0–8.0)

## 2021-01-03 LAB — C3 AND C4
C3 Complement: 114 mg/dL (ref 83–193)
C4 Complement: 29 mg/dL (ref 15–57)

## 2021-01-03 LAB — SEDIMENTATION RATE: Sed Rate: 9 mm/h (ref 0–30)

## 2021-01-03 NOTE — Progress Notes (Signed)
White cell count is low.  Please advise patient to repeat CBC in 1 month.  CMP is normal.  UA is negative.  Sed rate is normal.  Double-stranded ENA and complements are normal.  Labs do not indicate active autoimmune disease.

## 2021-01-04 ENCOUNTER — Other Ambulatory Visit: Payer: Self-pay | Admitting: *Deleted

## 2021-01-04 DIAGNOSIS — Z79899 Other long term (current) drug therapy: Secondary | ICD-10-CM

## 2021-01-04 DIAGNOSIS — M359 Systemic involvement of connective tissue, unspecified: Secondary | ICD-10-CM

## 2021-01-16 ENCOUNTER — Other Ambulatory Visit: Payer: Self-pay | Admitting: Physician Assistant

## 2021-01-16 DIAGNOSIS — M359 Systemic involvement of connective tissue, unspecified: Secondary | ICD-10-CM

## 2021-01-16 NOTE — Telephone Encounter (Signed)
Last Visit: 01/02/2021 Next Visit: 05/29/2021 Labs: 01/02/2021, White cell count is low.  Please advise patient to repeat CBC in 1 month.  CMP is normal.  UA is negative.  Sed rate is normal.  Double-stranded ENA andcomplements are normal.  Labs do not indicate active autoimmune disease.  Eye exam: 10'09/2019 WNL   Current Dose per office note 01/02/2021: Plaquenil 200 mg 1 tablet daily DX: Autoimmune disease  Last Fill: 10/17/2020  Okay to refill Plaquenil?

## 2021-03-14 ENCOUNTER — Other Ambulatory Visit: Payer: Self-pay | Admitting: *Deleted

## 2021-03-14 DIAGNOSIS — Z79899 Other long term (current) drug therapy: Secondary | ICD-10-CM

## 2021-03-14 DIAGNOSIS — M359 Systemic involvement of connective tissue, unspecified: Secondary | ICD-10-CM

## 2021-03-14 NOTE — Telephone Encounter (Signed)
Labs received from:[facility].   Drawn on:03/14/2021  Reviewed by:Sherron Ales, PA-C  Labs drawn:CBC with diff  Results:WBC 2.6 Previous WBC on 01/02/2021: 2.7  RBC 3.74  Hgb 11.7  Hct 33  Lymph # 0.6  Patient on PLQ 200 mg one tablet daily.   Recommendation: Please advise patient to have updated CBC with diff in 1 month. Please add HCQ level.    Patient advised of results and recommendations. Future orders placed.

## 2021-04-30 ENCOUNTER — Other Ambulatory Visit: Payer: Self-pay

## 2021-04-30 DIAGNOSIS — M359 Systemic involvement of connective tissue, unspecified: Secondary | ICD-10-CM

## 2021-04-30 MED ORDER — HYDROXYCHLOROQUINE SULFATE 200 MG PO TABS: 200.0000 mg | ORAL_TABLET | Freq: Every day | ORAL | 0 refills | Status: DC

## 2021-04-30 NOTE — Telephone Encounter (Signed)
Next Visit: 05/29/2021  Last Visit: 01/02/2021  Labs:  03/14/2021 WBC 2.6, RBC 3.14, Hgb 11.7, Hct 33, Lymph #0.6  Eye exam: 03/20/2020 WNL  Per patient eye appointment was changed from October to December, due to schedule conflict  Current Dose per office note 01/02/2021: Plaquenil 200 mg 1 tablet daily  KX:[id number] disease  Last Fill: 01/16/2021  Okay to refill Plaquenil?

## 2021-05-15 NOTE — Progress Notes (Deleted)
Office Visit Note  Patient: Suzanne [name]             Date of Birth: [birth year]           MRN: [id number]             PCP: Marylen Ponto, MD Referring: Marylen Ponto, MD Visit Date: 05/29/2021 Occupation: @GUAROCC @  Subjective:  No chief complaint on file.   History of Present Illness: Suzanne [name] is a 60 y.o. female ***   Activities of Daily Living:  Patient reports morning stiffness for *** {minute/hour:19697}.   Patient {ACTIONS;DENIES/REPORTS:21021675::"Denies"} nocturnal pain.  Difficulty dressing/grooming: {ACTIONS;DENIES/REPORTS:21021675::"Denies"} Difficulty climbing stairs: {ACTIONS;DENIES/REPORTS:21021675::"Denies"} Difficulty getting out of chair: {ACTIONS;DENIES/REPORTS:21021675::"Denies"} Difficulty using hands for taps, buttons, cutlery, and/or writing: {ACTIONS;DENIES/REPORTS:21021675::"Denies"}  No Rheumatology ROS completed.   PMFS History:  Patient Active Problem List   Diagnosis Date Noted   Autoimmune disease (HCC) 08/26/2016   High risk medication use 08/26/2016   Psoriasis 08/26/2016   Primary osteoarthritis of both hands 08/26/2016   History of Sjogren's disease (HCC) 08/26/2016   Pain, joint, knee, left 08/26/2016   History of hypertension 08/26/2016   H/O rosacea 08/26/2016    Past Medical History:  Diagnosis Date   Hypertension     Family History  Problem Relation Age of Onset   Hypertension Mother    Stroke Mother    Hypercholesterolemia Mother    Cancer Father    Clotting disorder Daughter    Hashimoto's thyroiditis Daughter    Rheum arthritis Daughter    No past surgical history on file. Social History   Social History Narrative   Not on file   Immunization History  Administered Date(s) Administered   Influenza,inj,Quad PF,6+ Mos 03/23/2017   Janssen (J&J) SARS-COV-2 Vaccination 09/22/2019   PFIZER(Purple Top)SARS-COV-2 Vaccination 04/23/2020     Objective: Vital Signs: There were no vitals taken for this  visit.   Physical Exam   Musculoskeletal Exam: ***  CDAI Exam: CDAI Score: -- Patient Global: --; Provider Global: -- Swollen: --; Tender: -- Joint Exam 05/29/2021   No joint exam has been documented for this visit   There is currently no information documented on the homunculus. Go to the Rheumatology activity and complete the homunculus joint exam.  Investigation: No additional findings.  Imaging: No results found.  Recent Labs: Lab Results  Component Value Date   WBC 2.7 (L) 01/02/2021   HGB 11.9 01/02/2021   PLT 154 01/02/2021   NA 138 01/02/2021   K 3.6 01/02/2021   CL 102 01/02/2021   CO2 29 01/02/2021   GLUCOSE 95 01/02/2021   BUN 16 01/02/2021   CREATININE 1.01 01/02/2021   BILITOT 0.5 01/02/2021   ALKPHOS 59 08/05/2016   AST 19 01/02/2021   ALT 16 01/02/2021   PROT 7.3 01/02/2021   ALBUMIN 4.4 08/05/2016   CALCIUM 9.3 01/02/2021   GFRAA 85 07/27/2020    Speciality Comments: PLaquenil eye exam normal on 03/20/2020 WNL @ 05/20/2020 Follow up 1 year  Procedures:  No procedures performed Allergies: Patient has no known allergies.   Assessment / Plan:     Visit Diagnoses: No diagnosis found.  Orders: No orders of the defined types were placed in this encounter.  No orders of the defined types were placed in this encounter.   Face-to-face time spent with patient was *** minutes. Greater than 50% of time was spent in counseling and coordination of care.  Follow-Up Instructions: No follow-ups on file.  Earnestine Mealing, CMA  Note - This record has been created using Editor, commissioning.  Chart creation errors have been sought, but may not always  have been located. Such creation errors do not reflect on  the standard of medical care.

## 2021-05-29 ENCOUNTER — Ambulatory Visit: Payer: 59 | Admitting: Physician Assistant

## 2021-05-29 DIAGNOSIS — Z8679 Personal history of other diseases of the circulatory system: Secondary | ICD-10-CM

## 2021-05-29 DIAGNOSIS — Z872 Personal history of diseases of the skin and subcutaneous tissue: Secondary | ICD-10-CM

## 2021-05-29 DIAGNOSIS — M359 Systemic involvement of connective tissue, unspecified: Secondary | ICD-10-CM

## 2021-05-29 DIAGNOSIS — Z8639 Personal history of other endocrine, nutritional and metabolic disease: Secondary | ICD-10-CM

## 2021-05-29 DIAGNOSIS — M19041 Primary osteoarthritis, right hand: Secondary | ICD-10-CM

## 2021-05-29 DIAGNOSIS — Z79899 Other long term (current) drug therapy: Secondary | ICD-10-CM

## 2021-05-29 DIAGNOSIS — L409 Psoriasis, unspecified: Secondary | ICD-10-CM

## 2021-05-29 DIAGNOSIS — M19071 Primary osteoarthritis, right ankle and foot: Secondary | ICD-10-CM

## 2021-06-13 HISTORY — PX: TOOTH EXTRACTION: SUR596

## 2021-06-21 NOTE — Progress Notes (Signed)
Office Visit Note  Patient: Suzanne [name]             Date of Birth: 10/22/1960           MRN: [id number]             PCP: Ronita Hipps, MD Referring: Ronita Hipps, MD Visit Date: 07/03/2021 Occupation: '@GUAROCC' @  Subjective:  Facial rash  History of Present Illness: Suzanne [name] is a 61 y.o. female with history of autoimmune disease and osteoarthritis. She is taking plaquenil 200 mg 1 tablet by mouth daily.  She continues to tolerate Plaquenil without any side effects.  She denies any signs or symptoms of an autoimmune disease flare.  She continues to have chronic dryness in her eyes and nose.  She has been using eyedrops on a daily basis as well as had new plugs placed in December 2022.  She denies any oral or nasal ulcerations.  She has not noticed any increased hair loss.  She has noticed some increased redness on her face but has not scheduled an appointment with dermatology for further evaluation.  She denies any shortness of breath, pleuritic chest pain, or palpitations.  She has not had any symptoms of Raynaud's.  She denies any increased joint pain or joint swelling.    Activities of Daily Living:  Patient reports morning stiffness for 20-30 minutes.   Patient Denies nocturnal pain.  Difficulty dressing/grooming: Denies Difficulty climbing stairs: Denies Difficulty getting out of chair: Denies Difficulty using hands for taps, buttons, cutlery, and/or writing: Denies  Review of Systems  Constitutional:  Positive for fatigue.  HENT:  Positive for nose dryness. Negative for mouth sores and mouth dryness.   Eyes:  Positive for dryness. Negative for pain and itching.  Respiratory:  Negative for shortness of breath and difficulty breathing.   Cardiovascular:  Negative for chest pain and palpitations.  Gastrointestinal:  Negative for blood in stool, constipation and diarrhea.  Endocrine: Negative for increased urination.  Genitourinary:  Negative for difficulty  urinating.  Musculoskeletal:  Positive for joint pain, joint pain, myalgias, muscle tenderness and myalgias. Negative for joint swelling and morning stiffness.  Skin:  Positive for rash and redness. Negative for color change.  Allergic/Immunologic: Negative for susceptible to infections.  Neurological:  Positive for weakness. Negative for dizziness, numbness, headaches and memory loss.  Hematological:  Negative for bruising/bleeding tendency.  Psychiatric/Behavioral:  Negative for confusion.    PMFS History:  Patient Active Problem List   Diagnosis Date Noted   Autoimmune disease ([place]) 08/26/2016   High risk medication use 08/26/2016   Psoriasis 08/26/2016   Primary osteoarthritis of both hands 08/26/2016   History of Sjogren's disease ([place]) 08/26/2016   Pain, joint, knee, left 08/26/2016   History of hypertension 08/26/2016   H/O rosacea 08/26/2016    Past Medical History:  Diagnosis Date   Hypertension     Family History  Problem Relation Age of Onset   Hypertension Mother    Stroke Mother    Hypercholesterolemia Mother    Cancer Father    Clotting disorder Daughter    Hashimoto's thyroiditis Daughter    Rheum arthritis Daughter    Past Surgical History:  Procedure Laterality Date   TOOTH EXTRACTION  06/13/2021   Social History   Social History Narrative   Not on file   Immunization History  Administered Date(s) Administered   Influenza,inj,Quad PF,6+ Mos 03/23/2017   Janssen (J&J) SARS-COV-2 Vaccination 09/22/2019   PFIZER(Purple Top)SARS-COV-2 Vaccination  04/23/2020     Objective: Vital Signs: BP (!) 167/78 (BP Location: Left Arm, Patient Position: Sitting, Cuff Size: Normal)    Pulse 69    Ht '5\' 7"'  (1.702 m)    Wt 169 lb 6.4 oz (76.8 kg)    BMI 26.53 kg/m    Physical Exam Vitals and nursing note reviewed.  Constitutional:      Appearance: She is well-developed.  HENT:     Head: Normocephalic and atraumatic.     Mouth/Throat:     Comments: No parotid  swelling or tenderness noted Eyes:     Conjunctiva/sclera: Conjunctivae normal.  Cardiovascular:     Rate and Rhythm: Normal rate and regular rhythm.     Heart sounds: Normal heart sounds.  Pulmonary:     Effort: Pulmonary effort is normal.     Breath sounds: Normal breath sounds.  Abdominal:     General: Bowel sounds are normal.     Palpations: Abdomen is soft.  Musculoskeletal:     Cervical back: Normal range of motion.  Lymphadenopathy:     Cervical: No cervical adenopathy.  Skin:    General: Skin is warm and dry.     Capillary Refill: Capillary refill takes less than 2 seconds.     Comments: Facial erythema noted  Neurological:     Mental Status: She is alert and oriented to person, place, and time.  Psychiatric:        Behavior: Behavior normal.     Musculoskeletal Exam: C-spine, thoracic spine, lumbar spine have good range of motion with no discomfort.  No midline spinal tenderness.  Tenderness of the paraspinal muscles in the thoracic region on the right side noted.  Shoulder joints, elbow joints, wrist joints, MCPs, PIPs, DIPs have good range of motion with no synovitis.  Complete fist formation bilaterally.  Mild DIP prominence noted.  Hip joints have good range of motion with no groin pain.  Knee joints have good range of motion with no warmth or effusion.  Ankle joints have good range of motion with no tenderness or joint swelling.  CDAI Exam: CDAI Score: -- Patient Global: --; Provider Global: -- Swollen: --; Tender: -- Joint Exam 07/03/2021   No joint exam has been documented for this visit   There is currently no information documented on the homunculus. Go to the Rheumatology activity and complete the homunculus joint exam.  Investigation: No additional findings.  Imaging: No results found.  Recent Labs: Lab Results  Component Value Date   WBC 2.7 (L) 01/02/2021   HGB 11.9 01/02/2021   PLT 154 01/02/2021   NA 138 01/02/2021   K 3.6 01/02/2021   CL  102 01/02/2021   CO2 29 01/02/2021   GLUCOSE 95 01/02/2021   BUN 16 01/02/2021   CREATININE 1.01 01/02/2021   BILITOT 0.5 01/02/2021   ALKPHOS 59 08/05/2016   AST 19 01/02/2021   ALT 16 01/02/2021   PROT 7.3 01/02/2021   ALBUMIN 4.4 08/05/2016   CALCIUM 9.3 01/02/2021   GFRAA 85 07/27/2020    Speciality Comments: PLaquenil eye exam normal on 06/13/2021  @ [facility] Follow up 1 year  Procedures:  No procedures performed Allergies: Patient has no known allergies.   Assessment / Plan:     Visit Diagnoses: Autoimmune disease ([place]) - +ANA, +RF, +Ro, +La, +ACE. h/o sicca symptoms, malar rash, fatigue, neutropenia:  She has not had any signs or symptoms of a flare since her last office visit.  She has  clinically been doing well taking Plaquenil 200 mg 1 tablet by mouth daily.  She continues to tolerate Plaquenil without any side effects.  Lab work from 01/02/2021 was reviewed with the patient today in the office: No proteinuria, ESR 9, complements within normal limits, double-stranded DNA negative, and white blood cell count was 2.7.  Patient has a longstanding history of lymphopenia.  CBC with differential was rechecked on 03/14/2021: White blood cell count was 2.6 at that time. Lab work was not consistent with active disease at that time.  She has not noticed any new or worsening symptoms since her last office visit.  She continues to have chronic dry eyes, nose dryness, intermittent fatigue, and facial erythema.  She has not had any increased mouth dryness.  She has not had any oral or nasal ulcerations.  She has not had any symptoms of Raynaud's.  No shortness of breath, pleuritic chest pain, or palpitations.  Her lungs were clear to auscultation on examination today.  She is not experiencing any increased joint pain, stiffness, or inflammation.  She had no synovitis on examination today. The patient was advised to follow-up with dermatology for further evaluation of the facial  erythema she has been experiencing.  She has a history of psoriasis as well as seborrheic dermatitis.  She has tried several topical agents with no improvement in her symptoms. The following lab work will be updated today.  She will remain on the current dose of Plaquenil.  Hydroxychloroquine blood level will be updated today since it has been at least 4 hours since her dose of Plaquenil.  She was advised to notify us if she develops any signs or symptoms of a flare.  She will follow-up in the office in 5 months.- Plan: CBC with Differential/Platelet, COMPLETE METABOLIC PANEL WITH GFR, Urinalysis, Routine w reflex microscopic, ANA, Anti-DNA antibody, double-stranded, C3 and C4, Sedimentation rate, Hydroxychloroquine, Blood, hydroxychloroquine (PLAQUENIL) 200 MG tablet  High risk medication use - Plaquenil 200 mg 1 tablet by mouth daily. Plaquenil eye exam normal on 06/13/2021.  CBC and CMP updated on 03/14/21.  CBC and CMP will be updated today to monitor for drug toxicity.  - Plan: CBC with Differential/Platelet, COMPLETE METABOLIC PANEL WITH GFR, Hydroxychloroquine, Blood  Primary osteoarthritis of both hands: She has mild PIP and DIP prominence consistent with osteoarthritis of both hands.  No tenderness or inflammation noted on examination.  She has complete fist formation bilaterally.    Primary osteoarthritis of both feet: She is not experiencing any discomfort in her feet at this time.  She has good ROM of both ankle joints with no tenderness or joint swelling. She is wearing proper fitting shoes.   Other medical conditions are listed as follows:   Psoriasis   H/O rosacea: Advised the patient to schedule an appointment with dermatology.  The patient declined a referral today.   History of vitamin D deficiency  History of hypertension    Orders: Orders Placed This Encounter  Procedures   CBC with Differential/Platelet   COMPLETE METABOLIC PANEL WITH GFR   Urinalysis, Routine w reflex  microscopic   ANA   Anti-DNA antibody, double-stranded   C3 and C4   Sedimentation rate   Hydroxychloroquine, Blood   Meds ordered this encounter  Medications   hydroxychloroquine (PLAQUENIL) 200 MG tablet    Sig: Take 1 tablet (200 mg total) by mouth daily.    Dispense:  90 tablet    Refill:  0     Follow-Up Instructions: Return  in about 5 months (around 12/01/2021) for Autoimmune Disease.   Ofilia Neas, PA-C  Note - This record has been created using Dragon software.  Chart creation errors have been sought, but may not always  have been located. Such creation errors do not reflect on  the standard of medical care.

## 2021-07-03 ENCOUNTER — Other Ambulatory Visit: Payer: Self-pay

## 2021-07-03 ENCOUNTER — Ambulatory Visit (INDEPENDENT_AMBULATORY_CARE_PROVIDER_SITE_OTHER): Payer: 59 | Admitting: Physician Assistant

## 2021-07-03 ENCOUNTER — Encounter: Payer: Self-pay | Admitting: Physician Assistant

## 2021-07-03 VITALS — BP 167/78 | HR 69 | Ht 67.0 in | Wt 169.4 lb

## 2021-07-03 DIAGNOSIS — Z79899 Other long term (current) drug therapy: Secondary | ICD-10-CM | POA: Diagnosis not present

## 2021-07-03 DIAGNOSIS — L409 Psoriasis, unspecified: Secondary | ICD-10-CM

## 2021-07-03 DIAGNOSIS — Z8639 Personal history of other endocrine, nutritional and metabolic disease: Secondary | ICD-10-CM

## 2021-07-03 DIAGNOSIS — M19042 Primary osteoarthritis, left hand: Secondary | ICD-10-CM

## 2021-07-03 DIAGNOSIS — M19041 Primary osteoarthritis, right hand: Secondary | ICD-10-CM

## 2021-07-03 DIAGNOSIS — M359 Systemic involvement of connective tissue, unspecified: Secondary | ICD-10-CM

## 2021-07-03 DIAGNOSIS — Z872 Personal history of diseases of the skin and subcutaneous tissue: Secondary | ICD-10-CM

## 2021-07-03 DIAGNOSIS — M19071 Primary osteoarthritis, right ankle and foot: Secondary | ICD-10-CM | POA: Diagnosis not present

## 2021-07-03 DIAGNOSIS — Z8679 Personal history of other diseases of the circulatory system: Secondary | ICD-10-CM

## 2021-07-03 DIAGNOSIS — M19072 Primary osteoarthritis, left ankle and foot: Secondary | ICD-10-CM

## 2021-07-03 MED ORDER — HYDROXYCHLOROQUINE SULFATE 200 MG PO TABS: 200.0000 mg | ORAL_TABLET | Freq: Every day | ORAL | 0 refills | Status: DC

## 2021-07-04 NOTE — Progress Notes (Signed)
Creatinine is slightly elevated-1.14 and GFR is low-54.  Please advise the patient to avoid NSAIDs.  Rest of CMP WNL.  CBC WNL.  ESR WNL.  UA normal.  Complements WNL.

## 2021-07-06 NOTE — Progress Notes (Signed)
ANA remains positive-low, unchanged titer. dsDNA is negative.  Labs are not consistent with a flare.

## 2021-07-13 LAB — COMPLETE METABOLIC PANEL WITH GFR
AG Ratio: 1.7 (calc) (ref 1.0–2.5)
ALT: 17 U/L (ref 6–29)
AST: 18 U/L (ref 10–35)
Albumin: 4.5 g/dL (ref 3.6–5.1)
Alkaline phosphatase (APISO): 61 U/L (ref 37–153)
BUN/Creatinine Ratio: 19 (calc) (ref 6–22)
BUN: 22 mg/dL (ref 7–25)
CO2: 30 mmol/L (ref 20–32)
Calcium: 9.4 mg/dL (ref 8.6–10.4)
Chloride: 103 mmol/L (ref 98–110)
Creat: 1.14 mg/dL — ABNORMAL HIGH (ref 0.50–1.05)
Globulin: 2.7 g/dL (calc) (ref 1.9–3.7)
Glucose, Bld: 87 mg/dL (ref 65–99)
Potassium: 4.1 mmol/L (ref 3.5–5.3)
Sodium: 139 mmol/L (ref 135–146)
Total Bilirubin: 0.5 mg/dL (ref 0.2–1.2)
Total Protein: 7.2 g/dL (ref 6.1–8.1)
eGFR: 54 mL/min/{1.73_m2} — ABNORMAL LOW (ref >=60)

## 2021-07-13 LAB — CBC WITH DIFFERENTIAL/PLATELET
Absolute Monocytes: 394 cells/uL (ref 200–950)
Basophils Absolute: 29 cells/uL (ref 0–200)
Basophils Relative: 0.7 %
Eosinophils Absolute: 62 cells/uL (ref 15–500)
Eosinophils Relative: 1.5 %
HCT: 36.6 % (ref 35.0–45.0)
Hemoglobin: 12.3 g/dL (ref 11.7–15.5)
Lymphs Abs: 1054 cells/uL (ref 850–3900)
MCH: 30.8 pg (ref 27.0–33.0)
MCHC: 33.6 g/dL (ref 32.0–36.0)
MCV: 91.7 fL (ref 80.0–100.0)
MPV: 9.9 fL (ref 7.5–12.5)
Monocytes Relative: 9.6 %
Neutro Abs: 2563 cells/uL (ref 1500–7800)
Neutrophils Relative %: 62.5 %
Platelets: 236 10*3/uL (ref 140–400)
RBC: 3.99 10*6/uL (ref 3.80–5.10)
RDW: 12.9 % (ref 11.0–15.0)
Total Lymphocyte: 25.7 %
WBC: 4.1 10*3/uL (ref 3.8–10.8)

## 2021-07-13 LAB — URINALYSIS, ROUTINE W REFLEX MICROSCOPIC
Bilirubin Urine: NEGATIVE
Glucose, UA: NEGATIVE
Hgb urine dipstick: NEGATIVE
Ketones, ur: NEGATIVE
Leukocytes,Ua: NEGATIVE
Nitrite: NEGATIVE
Protein, ur: NEGATIVE
Specific Gravity, Urine: 1.016 (ref 1.001–1.035)
pH: 6.5 (ref 5.0–8.0)

## 2021-07-13 LAB — ANTI-NUCLEAR AB-TITER (ANA TITER): ANA Titer 1: 1:80 {titer} — ABNORMAL HIGH

## 2021-07-13 LAB — ANA: Anti Nuclear Antibody (ANA): POSITIVE — AB

## 2021-07-13 LAB — HYDROXYCHLOROQUINE,BLOOD: HYDROXYCHLOROQUINE, (B): 660 ng/mL — ABNORMAL HIGH

## 2021-07-13 LAB — C3 AND C4
C3 Complement: 108 mg/dL (ref 83–193)
C4 Complement: 24 mg/dL (ref 15–57)

## 2021-07-13 LAB — SEDIMENTATION RATE: Sed Rate: 9 mm/h (ref 0–30)

## 2021-07-13 LAB — ANTI-DNA ANTIBODY, DOUBLE-STRANDED: ds DNA Ab: <1 IU/mL

## 2021-07-13 NOTE — Progress Notes (Signed)
Hydroxychloroquine level is 660.  Reviewed with Dr. Corliss Skains. Ok to continue on current dose of plaquenil.

## 2021-10-23 ENCOUNTER — Other Ambulatory Visit: Payer: Self-pay | Admitting: *Deleted

## 2021-10-23 DIAGNOSIS — M359 Systemic involvement of connective tissue, unspecified: Secondary | ICD-10-CM

## 2021-10-23 MED ORDER — HYDROXYCHLOROQUINE SULFATE 200 MG PO TABS: 200.0000 mg | ORAL_TABLET | Freq: Every day | ORAL | 0 refills | Status: DC

## 2021-10-23 NOTE — Telephone Encounter (Signed)
Next Visit: 12/05/2021 ? ?Last Visit: 07/03/2021 ? ?Labs: 1/17/2023Creatinine is slightly elevated-1.14 and GFR is low-54. Rest of CMP WNL.  CBC WNL. ? ?Eye exam: 06/13/2021    ? ?Current Dose per office note 07/03/2021: Plaquenil 200 mg 1 tablet by mouth daily ? ?OI:[id number] disease  ? ?Last Fill: 07/03/2021 ? ?Okay to refill Plaquenil?  ?

## 2021-11-30 NOTE — Progress Notes (Unsigned)
Office Visit Note  Patient: Suzanne [name]             Date of Birth: 08/28/1960           MRN: [id number]             PCP: Marylen Ponto, MD Referring: Marylen Ponto, MD Visit Date: 12/05/2021 Occupation: @GUAROCC @  Subjective:  No chief complaint on file.   History of Present Illness: [name] is a 61 y.o. female ***   Activities of Daily Living:  Patient reports morning stiffness for *** {minute/hour:19697}.   Patient {ACTIONS;DENIES/REPORTS:21021675::"Denies"} nocturnal pain.  Difficulty dressing/grooming: {ACTIONS;DENIES/REPORTS:21021675::"Denies"} Difficulty climbing stairs: {ACTIONS;DENIES/REPORTS:21021675::"Denies"} Difficulty getting out of chair: {ACTIONS;DENIES/REPORTS:21021675::"Denies"} Difficulty using hands for taps, buttons, cutlery, and/or writing: {ACTIONS;DENIES/REPORTS:21021675::"Denies"}  No Rheumatology ROS completed.   PMFS History:  Patient Active Problem List   Diagnosis Date Noted  . Autoimmune disease ([facility]) 08/26/2016  . High risk medication use 08/26/2016  . Psoriasis 08/26/2016  . Primary osteoarthritis of both hands 08/26/2016  . History of Sjogren's disease ([facility]) 08/26/2016  . Pain, joint, knee, left 08/26/2016  . History of hypertension 08/26/2016  . H/O rosacea 08/26/2016    Past Medical History:  Diagnosis Date  . Hypertension     Family History  Problem Relation Age of Onset  . Hypertension Mother   . Stroke Mother   . Hypercholesterolemia Mother   . Cancer Father   . Clotting disorder Daughter   . Hashimoto's thyroiditis Daughter   . Rheum arthritis Daughter    Past Surgical History:  Procedure Laterality Date  . TOOTH EXTRACTION  06/13/2021   Social History   Social History Narrative  . Not on file   Immunization History  Administered Date(s) Administered  . Influenza,inj,Quad PF,6+ Mos 03/23/2017  . Janssen (J&J) SARS-COV-2 Vaccination 09/22/2019  . PFIZER(Purple Top)SARS-COV-2 Vaccination  04/23/2020     Objective: Vital Signs: There were no vitals taken for this visit.   Physical Exam   Musculoskeletal Exam: ***  CDAI Exam: CDAI Score: -- Patient Global: --; Provider Global: -- Swollen: --; Tender: -- Joint Exam 12/05/2021   No joint exam has been documented for this visit   There is currently no information documented on the homunculus. Go to the Rheumatology activity and complete the homunculus joint exam.  Investigation: No additional findings.  Imaging: No results found.  Recent Labs: Lab Results  Component Value Date   WBC 4.1 07/03/2021   HGB 12.3 07/03/2021   PLT 236 07/03/2021   NA 139 07/03/2021   K 4.1 07/03/2021   CL 103 07/03/2021   CO2 30 07/03/2021   GLUCOSE 87 07/03/2021   BUN 22 07/03/2021   CREATININE 1.14 (H) 07/03/2021   BILITOT 0.5 07/03/2021   ALKPHOS 59 08/05/2016   AST 18 07/03/2021   ALT 17 07/03/2021   PROT 7.2 07/03/2021   ALBUMIN 4.4 08/05/2016   CALCIUM 9.4 07/03/2021   GFRAA 85 07/27/2020    Speciality Comments: PLaquenil eye exam normal on 06/13/2021  @ [facility] Follow up 1 year  Procedures:  No procedures performed Allergies: Patient has no known allergies.   Assessment / Plan:     Visit Diagnoses: Autoimmune disease ([facility])  High risk medication use  Primary osteoarthritis of both hands  Primary osteoarthritis of both feet  Psoriasis  H/O rosacea  History of vitamin D deficiency  History of hypertension  Orders: No orders of the defined types were placed in this encounter.  No orders  of the defined types were placed in this encounter.   Face-to-face time spent with patient was *** minutes. Greater than 50% of time was spent in counseling and coordination of care.  Follow-Up Instructions: No follow-ups on file.   Gearldine Bienenstock, PA-C  Note - This record has been created using Dragon software.  Chart creation errors have been sought, but may not always  have been located.  Such creation errors do not reflect on  the standard of medical care.

## 2021-12-05 ENCOUNTER — Ambulatory Visit (INDEPENDENT_AMBULATORY_CARE_PROVIDER_SITE_OTHER): Payer: 59 | Admitting: Physician Assistant

## 2021-12-05 ENCOUNTER — Encounter: Payer: Self-pay | Admitting: Physician Assistant

## 2021-12-05 VITALS — BP 144/76 | HR 57 | Resp 15 | Ht 67.0 in | Wt 167.6 lb

## 2021-12-05 DIAGNOSIS — Z8679 Personal history of other diseases of the circulatory system: Secondary | ICD-10-CM

## 2021-12-05 DIAGNOSIS — M7072 Other bursitis of hip, left hip: Secondary | ICD-10-CM

## 2021-12-05 DIAGNOSIS — Z79899 Other long term (current) drug therapy: Secondary | ICD-10-CM | POA: Diagnosis not present

## 2021-12-05 DIAGNOSIS — M19071 Primary osteoarthritis, right ankle and foot: Secondary | ICD-10-CM

## 2021-12-05 DIAGNOSIS — M19072 Primary osteoarthritis, left ankle and foot: Secondary | ICD-10-CM

## 2021-12-05 DIAGNOSIS — M359 Systemic involvement of connective tissue, unspecified: Secondary | ICD-10-CM

## 2021-12-05 DIAGNOSIS — Z872 Personal history of diseases of the skin and subcutaneous tissue: Secondary | ICD-10-CM

## 2021-12-05 DIAGNOSIS — M19041 Primary osteoarthritis, right hand: Secondary | ICD-10-CM | POA: Diagnosis not present

## 2021-12-05 DIAGNOSIS — L409 Psoriasis, unspecified: Secondary | ICD-10-CM

## 2021-12-05 DIAGNOSIS — Z8639 Personal history of other endocrine, nutritional and metabolic disease: Secondary | ICD-10-CM

## 2021-12-05 DIAGNOSIS — E559 Vitamin D deficiency, unspecified: Secondary | ICD-10-CM

## 2021-12-05 DIAGNOSIS — M19042 Primary osteoarthritis, left hand: Secondary | ICD-10-CM

## 2021-12-05 NOTE — Patient Instructions (Signed)
Knee Exercises Ask your health care provider which exercises are safe for you. Do exercises exactly as told by your health care provider and adjust them as directed. It is normal to feel mild stretching, pulling, tightness, or discomfort as you do these exercises. Stop right away if you feel sudden pain or your pain gets worse. Do not begin these exercises until told by your health care provider. Stretching and range-of-motion exercises These exercises warm up your muscles and joints and improve the movement and flexibility of your knee. These exercises also help to relieve pain and swelling. Knee extension, prone  Lie on your abdomen (prone position) on a bed. Place your left / right knee just beyond the edge of the surface so your knee is not on the bed. You can put a towel under your left / right thigh just above your kneecap for comfort. Relax your leg muscles and allow gravity to straighten your knee (extension). You should feel a stretch behind your left / right knee. Hold this position for __________ seconds. Scoot up so your knee is supported between repetitions. Repeat __________ times. Complete this exercise __________ times a day. Knee flexion, active  Lie on your back with both legs straight. If this causes back discomfort, bend your left / right knee so your foot is flat on the floor. Slowly slide your left / right heel back toward your buttocks. Stop when you feel a gentle stretch in the front of your knee or thigh (flexion). Hold this position for __________ seconds. Slowly slide your left / right heel back to the starting position. Repeat __________ times. Complete this exercise __________ times a day. Quadriceps stretch, prone  Lie on your abdomen on a firm surface, such as a bed or padded floor. Bend your left / right knee and hold your ankle. If you cannot reach your ankle or pant leg, loop a belt around your foot and grab the belt instead. Gently pull your heel toward your  buttocks. Your knee should not slide out to the side. You should feel a stretch in the front of your thigh and knee (quadriceps). Hold this position for __________ seconds. Repeat __________ times. Complete this exercise __________ times a day. Hamstring, supine  Lie on your back (supine position). Loop a belt or towel over the ball of your left / right foot. The ball of your foot is on the walking surface, right under your toes. Straighten your left / right knee and slowly pull on the belt to raise your leg until you feel a gentle stretch behind your knee (hamstring). Do not let your knee bend while you do this. Keep your other leg flat on the floor. Hold this position for __________ seconds. Repeat __________ times. Complete this exercise __________ times a day. Strengthening exercises These exercises build strength and endurance in your knee. Endurance is the ability to use your muscles for a long time, even after they get tired. Quadriceps, isometric This exercise strengthens the muscles in front of your thigh (quadriceps) without moving your knee joint (isometric). Lie on your back with your left / right leg extended and your other knee bent. Put a rolled towel or small pillow under your knee if told by your health care provider. Slowly tense the muscles in the front of your left / right thigh. You should see your kneecap slide up toward your hip or see increased dimpling just above the knee. This motion will push the back of the knee toward the floor.   For __________ seconds, hold the muscle as tight as you can without increasing your pain. Relax the muscles slowly and completely. Repeat __________ times. Complete this exercise __________ times a day. Straight leg raises This exercise strengthens the muscles in front of your thigh (quadriceps) and the muscles that move your hips (hip flexors). Lie on your back with your left / right leg extended and your other knee bent. Tense the  muscles in the front of your left / right thigh. You should see your kneecap slide up or see increased dimpling just above the knee. Your thigh may even shake a bit. Keep these muscles tight as you raise your leg 4-6 inches (10-15 cm) off the floor. Do not let your knee bend. Hold this position for __________ seconds. Keep these muscles tense as you lower your leg. Relax your muscles slowly and completely after each repetition. Repeat __________ times. Complete this exercise __________ times a day. Hamstring, isometric  Lie on your back on a firm surface. Bend your left / right knee about __________ degrees. Dig your left / right heel into the surface as if you are trying to pull it toward your buttocks. Tighten the muscles in the back of your thighs (hamstring) to "dig" as hard as you can without increasing any pain. Hold this position for __________ seconds. Release the tension gradually and allow your muscles to relax completely for __________ seconds after each repetition. Repeat __________ times. Complete this exercise __________ times a day. Hamstring curls If told by your health care provider, do this exercise while wearing ankle weights. Begin with __________lb / kg weights. Then increase the weight by 1 lb (0.5 kg) increments. Do not wear ankle weights that are more than __________lb / kg. Lie on your abdomen with your legs straight. Bend your left / right knee as far as you can without feeling pain. Keep your hips flat against the floor. Hold this position for __________ seconds. Slowly lower your leg to the starting position. Repeat __________ times. Complete this exercise __________ times a day. Squats This exercise strengthens the muscles in front of your thigh and knee (quadriceps). Stand in front of a table, with your feet and knees pointing straight ahead. You may rest your hands on the table for balance but not for support. Slowly bend your knees and lower your hips like you  are going to sit in a chair. Keep your weight over your heels, not over your toes. Keep your lower legs upright so they are parallel with the table legs. Do not let your hips go lower than your knees. Do not bend lower than told by your health care provider. If your knee pain increases, do not bend as low. Hold the squat position for __________ seconds. Slowly push with your legs to return to standing. Do not use your hands to pull yourself to standing. Repeat __________ times. Complete this exercise __________ times a day. Wall slides This exercise strengthens the muscles in front of your thigh and knee (quadriceps). Lean your back against a smooth wall or door, and walk your feet out 18-24 inches (46-61 cm) from it. Place your feet hip-width apart. Slowly slide down the wall or door until your knees bend __________ degrees. Keep your knees over your heels, not over your toes. Keep your knees in line with your hips. Hold this position for __________ seconds. Repeat __________ times. Complete this exercise __________ times a day. Straight leg raises, side-lying This exercise strengthens the muscles that rotate   the leg at the hip and move it away from your body (hip abductors). Lie on your side with your left / right leg in the top position. Lie so your head, shoulder, knee, and hip line up. You may bend your bottom knee to help you keep your balance. Roll your hips slightly forward so your hips are stacked directly over each other and your left / right knee is facing forward. Leading with your heel, lift your top leg 4-6 inches (10-15 cm). You should feel the muscles in your outer hip lifting. Do not let your foot drift forward. Do not let your knee roll toward the ceiling. Hold this position for __________ seconds. Slowly return your leg to the starting position. Let your muscles relax completely after each repetition. Repeat __________ times. Complete this exercise __________ times a  day. Straight leg raises, prone This exercise stretches the muscles that move your hips away from the front of the pelvis (hip extensors). Lie on your abdomen on a firm surface. You can put a pillow under your hips if that is more comfortable. Tense the muscles in your buttocks and lift your left / right leg about 4-6 inches (10-15 cm). Keep your knee straight as you lift your leg. Hold this position for __________ seconds. Slowly lower your leg to the starting position. Let your leg relax completely after each repetition. Repeat __________ times. Complete this exercise __________ times a day. This information is not intended to replace advice given to you by your health care provider. Make sure you discuss any questions you have with your health care provider. Document Revised: 02/13/2021 Document Reviewed: 02/13/2021 Elsevier Patient Education  2023 [employer] Hip Bursitis Rehab Ask your health care provider which exercises are safe for you. Do exercises exactly as told by your health care provider and adjust them as directed. It is normal to feel mild stretching, pulling, tightness, or discomfort as you do these exercises. Stop right away if you feel sudden pain or your pain gets worse. Do not begin these exercises until told by your health care provider. Stretching exercise This exercise warms up your muscles and joints and improves the movement and flexibility of your hip. This exercise also helps to relieve pain and stiffness. Iliotibial band stretch An iliotibial band is a strong band of muscle tissue that runs from the outer side of your hip to the outer side of your thigh and knee. Lie on your side with your left / right leg in the top position. Bend your left / right knee and grab your ankle. Stretch out your bottom arm to help you balance. Slowly bring your knee back so your thigh is slightly behind your body. Slowly lower your knee toward the floor until you feel a gentle stretch  on the outside of your left / right thigh. If you do not feel a stretch and your knee will not lower more toward the floor, place the heel of your other foot on top of your knee and pull your knee down toward the floor with your foot. Hold this position for __________ seconds. Slowly return to the starting position. Repeat __________ times. Complete this exercise __________ times a day. Strengthening exercises These exercises build strength and endurance in your hip and pelvis. Endurance is the ability to use your muscles for a long time, even after they get tired. Bridge This exercise strengthens the muscles that move your thigh backward (hip extensors). Lie on your back on a firm surface with  your knees bent and your feet flat on the floor. Tighten your buttocks muscles and lift your buttocks off the floor until your trunk is level with your thighs. Do not arch your back. You should feel the muscles working in your buttocks and the back of your thighs. If you do not feel these muscles, slide your feet 1-2 inches (2.5-5 cm) farther away from your buttocks. If this exercise is too easy, try doing it with your arms crossed over your chest. Hold this position for __________ seconds. Slowly lower your hips to the starting position. Let your muscles relax completely after each repetition. Repeat __________ times. Complete this exercise __________ times a day. Squats This exercise strengthens the muscles in front of your thigh and knee (quadriceps). Stand in front of a table, with your feet and knees pointing straight ahead. You may rest your hands on the table for balance but not for support. Slowly bend your knees and lower your hips like you are going to sit in a chair. Keep your weight over your heels, not over your toes. Keep your lower legs upright so they are parallel with the table legs. Do not let your hips go lower than your knees. Do not bend lower than told by your health care  provider. If your hip pain increases, do not bend as low. Hold the squat position for __________ seconds. Slowly push with your legs to return to standing. Do not use your hands to pull yourself to standing. Repeat __________ times. Complete this exercise __________ times a day. Hip hike  Stand sideways on a bottom step. Stand on your left / right leg with your other foot unsupported next to the step. You can hold on to the railing or wall for balance if needed. Keep your knees straight and your torso square. Then lift your left / right hip up toward the ceiling. Hold this position for __________ seconds. Slowly let your left / right hip lower toward the floor, past the starting position. Your foot should get closer to the floor. Do not lean or bend your knees. Repeat __________ times. Complete this exercise __________ times a day. Single leg stand This exercise increases your balance. Without shoes, stand near a railing or in a doorway. You may hold on to the railing or door frame as needed for balance. Squeeze your left / right buttock muscles, then lift up your other foot. Do not let your left / right hip push out to the side. It is helpful to stand in front of a mirror for this exercise so you can watch your hip. Hold this position for __________ seconds. Repeat __________ times. Complete this exercise __________ times a day. This information is not intended to replace advice given to you by your health care provider. Make sure you discuss any questions you have with your health care provider. Document Revised: 05/16/2021 Document Reviewed: 05/16/2021 Elsevier Patient Education  2023 [employer].

## 2021-12-06 NOTE — Progress Notes (Signed)
Creatinine remains borderline elevated-1.07 and GFR is borderline low-58.  Rest of CMP WNL.  CBC WNL.  ESR and complements WNL.  Vitamin D WNL.  UA normal.

## 2021-12-07 LAB — CBC WITH DIFFERENTIAL/PLATELET
Absolute Monocytes: 383 cells/uL (ref 200–950)
Basophils Absolute: 22 cells/uL (ref 0–200)
Basophils Relative: 0.5 %
Eosinophils Absolute: 90 cells/uL (ref 15–500)
Eosinophils Relative: 2.1 %
HCT: 35.9 % (ref 35.0–45.0)
Hemoglobin: 12.1 g/dL (ref 11.7–15.5)
Lymphs Abs: 963 cells/uL (ref 850–3900)
MCH: 30.9 pg (ref 27.0–33.0)
MCHC: 33.7 g/dL (ref 32.0–36.0)
MCV: 91.6 fL (ref 80.0–100.0)
MPV: 10.9 fL (ref 7.5–12.5)
Monocytes Relative: 8.9 %
Neutro Abs: 2842 cells/uL (ref 1500–7800)
Neutrophils Relative %: 66.1 %
Platelets: 200 10*3/uL (ref 140–400)
RBC: 3.92 10*6/uL (ref 3.80–5.10)
RDW: 12.5 % (ref 11.0–15.0)
Total Lymphocyte: 22.4 %
WBC: 4.3 10*3/uL (ref 3.8–10.8)

## 2021-12-07 LAB — COMPLETE METABOLIC PANEL WITH GFR
AG Ratio: 1.6 (calc) (ref 1.0–2.5)
ALT: 18 U/L (ref 6–29)
AST: 18 U/L (ref 10–35)
Albumin: 4.6 g/dL (ref 3.6–5.1)
Alkaline phosphatase (APISO): 62 U/L (ref 37–153)
BUN/Creatinine Ratio: 16 (calc) (ref 6–22)
BUN: 17 mg/dL (ref 7–25)
CO2: 29 mmol/L (ref 20–32)
Calcium: 10 mg/dL (ref 8.6–10.4)
Chloride: 103 mmol/L (ref 98–110)
Creat: 1.07 mg/dL — ABNORMAL HIGH (ref 0.50–1.05)
Globulin: 2.9 g/dL (calc) (ref 1.9–3.7)
Glucose, Bld: 91 mg/dL (ref 65–99)
Potassium: 4.1 mmol/L (ref 3.5–5.3)
Sodium: 140 mmol/L (ref 135–146)
Total Bilirubin: 0.6 mg/dL (ref 0.2–1.2)
Total Protein: 7.5 g/dL (ref 6.1–8.1)
eGFR: 58 mL/min/{1.73_m2} — ABNORMAL LOW (ref >=60)

## 2021-12-07 LAB — ANTI-DNA ANTIBODY, DOUBLE-STRANDED: ds DNA Ab: <1 IU/mL

## 2021-12-07 LAB — C3 AND C4
C3 Complement: 117 mg/dL (ref 83–193)
C4 Complement: 29 mg/dL (ref 15–57)

## 2021-12-07 LAB — URINALYSIS, ROUTINE W REFLEX MICROSCOPIC
Bilirubin Urine: NEGATIVE
Glucose, UA: NEGATIVE
Hgb urine dipstick: NEGATIVE
Ketones, ur: NEGATIVE
Leukocytes,Ua: NEGATIVE
Nitrite: NEGATIVE
Protein, ur: NEGATIVE
Specific Gravity, Urine: 1.009 (ref 1.001–1.035)
pH: 6.5 (ref 5.0–8.0)

## 2021-12-07 LAB — SEDIMENTATION RATE: Sed Rate: 14 mm/h (ref 0–30)

## 2021-12-07 LAB — VITAMIN D 25 HYDROXY (VIT D DEFICIENCY, FRACTURES): Vit D, 25-Hydroxy: 40 ng/mL (ref 30–100)

## 2022-01-22 ENCOUNTER — Other Ambulatory Visit: Payer: Self-pay | Admitting: *Deleted

## 2022-01-22 DIAGNOSIS — M359 Systemic involvement of connective tissue, unspecified: Secondary | ICD-10-CM

## 2022-01-22 MED ORDER — HYDROXYCHLOROQUINE SULFATE 200 MG PO TABS: 200.0000 mg | ORAL_TABLET | Freq: Every day | ORAL | 0 refills | Status: DC

## 2022-01-22 NOTE — Telephone Encounter (Signed)
Next Visit: 05/22/2022  Last Visit: 12/05/2021  Labs: 12/05/2021 Creatinine remains borderline elevated-1.07 and GFR is borderline low-58.  Rest of CMP WNL.  CBC WNL.  Eye exam: 06/13/2021 WNL   Current Dose per office note 12/05/2021: Plaquenil 200 mg 1 tablet by mouth daily  ZM:[id number] disease   Last Fill: 10/23/2021  Okay to refill Plaquenil?

## 2022-04-23 ENCOUNTER — Other Ambulatory Visit: Payer: Self-pay | Admitting: *Deleted

## 2022-04-23 DIAGNOSIS — M359 Systemic involvement of connective tissue, unspecified: Secondary | ICD-10-CM

## 2022-04-23 MED ORDER — HYDROXYCHLOROQUINE SULFATE 200 MG PO TABS: 200.0000 mg | ORAL_TABLET | Freq: Every day | ORAL | 0 refills | Status: DC

## 2022-04-23 NOTE — Telephone Encounter (Signed)
From: Suzanne [name] To: Office of Suzanne [name], [place] Sent: 04/22/2022 8:35 PM EST Subject: Medication Renewal Request  Refills have been requested for the following medications:   hydroxychloroquine (PLAQUENIL) 200 MG tablet Suzanne [name]]  Patient Comment: Please refill 90 day RX at [facility] in [place]  Preferred pharmacy: [facility] # [place], [place] Delivery method: [employer]

## 2022-04-23 NOTE — Telephone Encounter (Signed)
Next Visit: 05/22/2022  Last Visit: 12/05/2021  Labs: 12/05/2021 creatinine remains borderline elevated-1.07 and GFR is borderline low-58.  Rest of CMP WNL.  CBC WNL.     Eye exam: 06/13/2021     Current Dose per office note 12/05/2021: Plaquenil 200 mg 1 tablet by mouth daily   DX: Autoimmune disease   Last Fill: 01/22/2022  Okay to refill Plaquenil?

## 2022-05-08 NOTE — Progress Notes (Signed)
Office Visit Note  Patient: Suzanne [name]             Date of Birth: 03-11-1961           MRN: [id number]             PCP: Marylen Ponto, MD Referring: Marylen Ponto, MD Visit Date: 05/22/2022 Occupation: @GUAROCC @  Subjective:  Medication management  History of Present Illness: Suzanne [name] is a 61 y.o. female with history of autoimmune disease, osteoarthritis and psoriasis.  She continues to have dry eyes and fatigue.  She denies any joint swelling.  She has a stiffness in her hands and feet due to underlying osteoarthritis.  She states that shoulder bursitis has improved since the last visit.  She denies any history of oral ulcers, nasal ulcers, malar rash, photosensitivity or Raynaud's phenomenon.  She noticed a rash on her left cheek and she has an appointment coming up with a dermatologist.  Activities of Daily Living:  Patient reports morning stiffness for 20 minutes.   Patient Denies nocturnal pain.  Difficulty dressing/grooming: Denies Difficulty climbing stairs: Denies Difficulty getting out of chair: Denies Difficulty using hands for taps, buttons, cutlery, and/or writing: Denies  Review of Systems  Constitutional:  Positive for fatigue.  HENT:  Negative for mouth sores and mouth dryness.   Eyes:  Positive for itching and dryness.  Respiratory:  Negative for shortness of breath.   Cardiovascular:  Negative for chest pain and palpitations.  Gastrointestinal:  Positive for constipation and diarrhea. Negative for blood in stool.  Endocrine: Negative for increased urination.  Genitourinary:  Negative for involuntary urination.  Musculoskeletal:  Positive for morning stiffness. Negative for joint pain, gait problem, joint pain, joint swelling, myalgias, muscle weakness, muscle tenderness and myalgias.  Skin:  Positive for rash. Negative for color change, hair loss and sensitivity to sunlight.  Allergic/Immunologic: Negative for susceptible to infections.   Neurological:  Negative for dizziness and headaches.  Hematological:  Negative for swollen glands.  Psychiatric/Behavioral:  Positive for sleep disturbance. Negative for depressed mood. The patient is not nervous/anxious.     PMFS History:  Patient Active Problem List   Diagnosis Date Noted   Autoimmune disease (HCC) 08/26/2016   High risk medication use 08/26/2016   Psoriasis 08/26/2016   Primary osteoarthritis of both hands 08/26/2016   History of Sjogren's disease (HCC) 08/26/2016   Pain, joint, knee, left 08/26/2016   History of hypertension 08/26/2016   H/O rosacea 08/26/2016    Past Medical History:  Diagnosis Date   Autoimmune disease (HCC)    Hypertension     Family History  Problem Relation Age of Onset   Hypertension Mother    Stroke Mother    Hypercholesterolemia Mother    Cancer Father    Clotting disorder Daughter    Hashimoto's thyroiditis Daughter    Rheum arthritis Daughter    Past Surgical History:  Procedure Laterality Date   TOOTH EXTRACTION  06/13/2021   Social History   Social History Narrative   Not on file   Immunization History  Administered Date(s) Administered   Influenza,inj,Quad PF,6+ Mos 03/23/2017   Janssen (J&J) SARS-COV-2 Vaccination 09/22/2019   PFIZER(Purple Top)SARS-COV-2 Vaccination 04/23/2020     Objective: Vital Signs: BP (!) 161/99 (BP Location: Left Arm, Patient Position: Sitting, Cuff Size: Normal)   Pulse (!) 58   Resp 15   Ht 5\' 7"  (1.702 m)   Wt 172 lb 12.8 oz (78.4 kg)   BMI  27.06 kg/m    Physical Exam Vitals and nursing note reviewed.  Constitutional:      Appearance: She is well-developed.  HENT:     Head: Normocephalic and atraumatic.  Eyes:     Conjunctiva/sclera: Conjunctivae normal.  Cardiovascular:     Rate and Rhythm: Normal rate and regular rhythm.     Heart sounds: Normal heart sounds.  Pulmonary:     Effort: Pulmonary effort is normal.     Breath sounds: Normal breath sounds.  Abdominal:      General: Bowel sounds are normal.     Palpations: Abdomen is soft.  Musculoskeletal:     Cervical back: Normal range of motion.  Lymphadenopathy:     Cervical: No cervical adenopathy.  Skin:    General: Skin is warm and dry.     Capillary Refill: Capillary refill takes less than 2 seconds.  Neurological:     Mental Status: She is alert and oriented to person, place, and time.  Psychiatric:        Behavior: Behavior normal.      Musculoskeletal Exam: Cervical, thoracic and lumbar spine were in good range of motion.  Shoulder joints, elbow joints, wrist joints, MCPs PIPs and DIPs with good range of motion.  She had bilateral PIP and DIP thickening with no synovitis.  Hip joints and knee joints in good range of motion without any warmth swelling or effusion.  There was no tenderness over ankles or MTPs.  CDAI Exam: CDAI Score: -- Patient Global: --; Provider Global: -- Swollen: --; Tender: -- Joint Exam 05/22/2022   No joint exam has been documented for this visit   There is currently no information documented on the homunculus. Go to the Rheumatology activity and complete the homunculus joint exam.  Investigation: No additional findings.  Imaging: No results found.  Recent Labs: Lab Results  Component Value Date   WBC 4.3 12/05/2021   HGB 12.1 12/05/2021   PLT 200 12/05/2021   NA 140 12/05/2021   K 4.1 12/05/2021   CL 103 12/05/2021   CO2 29 12/05/2021   GLUCOSE 91 12/05/2021   BUN 17 12/05/2021   CREATININE 1.07 (H) 12/05/2021   BILITOT 0.6 12/05/2021   ALKPHOS 59 08/05/2016   AST 18 12/05/2021   ALT 18 12/05/2021   PROT 7.5 12/05/2021   ALBUMIN 4.4 08/05/2016   CALCIUM 10.0 12/05/2021   GFRAA 85 07/27/2020    Speciality Comments: PLaquenil eye exam normal on 06/13/2021  @ [facility] Follow up 1 year  Procedures:  No procedures performed Allergies: Patient has no known allergies.   Assessment / Plan:     Visit Diagnoses: Autoimmune  disease (HCC) - +ANA, +RF, +Ro, +La, +ACE. h/o sicca symptoms, malar rash, fatigue, neutropenia: -She continues to have dry eye symptoms.  She denies any history of malar rash, photosensitivity, Raynaud's phenomenon or lymphadenopathy.  She denies any history of joint swelling.  She states that sicca symptoms are manageable with over-the-counter products.  I discussed the option of pilocarpine but she declined.  She wanted  information on pilocarpine to review which I placed in the AVS.  Plan: Protein / creatinine ratio, urine, Anti-DNA antibody, double-stranded, C3 and C4, Sedimentation rate, Sjogrens syndrome-A extractable nuclear antibody, Sjogrens syndrome-B extractable nuclear antibody.  Increased risk of ILD and lymphoma with Sjogren's was discussed.  She denies any shortness of breath.  High risk medication use - Plaquenil 200 mg 1 tablet by mouth daily. Plaquenil eye exam normal on  06/13/2021.  Patient states her eye examination is scheduled in January.  Labs obtained on December 05, 2021 were reviewed.  Creatinine was mildly elevated.  All other labs were unremarkable.  Complements and sed rate were normal.  Double-stranded ENA was negative.  I will check labs today.- Plan: CBC with Differential/Platelet, COMPLETE METABOLIC PANEL WITH GFR, Serum protein electrophoresis with reflex.  Primary osteoarthritis of both hands-she had bilateral PIP and DIP thickening.  Joint protection muscle strengthening was discussed.  Primary osteoarthritis of both feet-she has off-and-on discomfort in her feet.  Proper fitting shoes were advised.  Ischial bursitis of left side-she has noted improvement in her ischial bursitis.  Psoriasis-she is infrequent rash from psoriasis.  H/O rosacea-she has erythematous rash on her face.  She has an appointment coming up with the dermatologist.  History of hypertension-blood pressure was elevated today.  Repeat blood pressure was higher.  She was advised to monitor blood  pressure closely and follow-up with the PCP.  Vitamin D deficiency-she has been taking vitamin D supplement on a regular basis.  Her vitamin D was 40 on December 05, 2021.  Patient states she gets DEXA scan through her PCP.  Her last DEXA scan at age 49 was within normal limits per patient.  Orders: Orders Placed This Encounter  Procedures   Protein / creatinine ratio, urine   CBC with Differential/Platelet   COMPLETE METABOLIC PANEL WITH GFR   Anti-DNA antibody, double-stranded   C3 and C4   Sedimentation rate   Sjogrens syndrome-A extractable nuclear antibody   Sjogrens syndrome-B extractable nuclear antibody   Serum protein electrophoresis with reflex   No orders of the defined types were placed in this encounter.    Follow-Up Instructions: Return in about 5 months (around 10/21/2022) for Autoimmune disease.   Pollyann Savoy, MD  Note - This record has been created using Animal nutritionist.  Chart creation errors have been sought, but may not always  have been located. Such creation errors do not reflect on  the standard of medical care.

## 2022-05-22 ENCOUNTER — Ambulatory Visit: Payer: 59 | Attending: Rheumatology | Admitting: Rheumatology

## 2022-05-22 ENCOUNTER — Encounter: Payer: Self-pay | Admitting: Rheumatology

## 2022-05-22 VITALS — BP 161/99 | HR 58 | Resp 15 | Ht 67.0 in | Wt 172.8 lb

## 2022-05-22 DIAGNOSIS — M19042 Primary osteoarthritis, left hand: Secondary | ICD-10-CM

## 2022-05-22 DIAGNOSIS — M359 Systemic involvement of connective tissue, unspecified: Secondary | ICD-10-CM | POA: Diagnosis not present

## 2022-05-22 DIAGNOSIS — M19071 Primary osteoarthritis, right ankle and foot: Secondary | ICD-10-CM

## 2022-05-22 DIAGNOSIS — L409 Psoriasis, unspecified: Secondary | ICD-10-CM

## 2022-05-22 DIAGNOSIS — Z79899 Other long term (current) drug therapy: Secondary | ICD-10-CM | POA: Diagnosis not present

## 2022-05-22 DIAGNOSIS — M19041 Primary osteoarthritis, right hand: Secondary | ICD-10-CM

## 2022-05-22 DIAGNOSIS — M7072 Other bursitis of hip, left hip: Secondary | ICD-10-CM

## 2022-05-22 DIAGNOSIS — E559 Vitamin D deficiency, unspecified: Secondary | ICD-10-CM

## 2022-05-22 DIAGNOSIS — M19072 Primary osteoarthritis, left ankle and foot: Secondary | ICD-10-CM

## 2022-05-22 DIAGNOSIS — Z872 Personal history of diseases of the skin and subcutaneous tissue: Secondary | ICD-10-CM

## 2022-05-22 DIAGNOSIS — Z8679 Personal history of other diseases of the circulatory system: Secondary | ICD-10-CM

## 2022-05-22 NOTE — Patient Instructions (Addendum)
Pilocarpine Tablets What is this medication? PILOCARPINE (PYE loe KAR peen) treats dry mouth. It works by increasing the amount of saliva in the mouth, which makes it easier to speak and swallow. This medicine may be used for other purposes; ask your health care provider or pharmacist if you have questions. COMMON BRAND NAME(S): Salagen What should I tell my care team before I take this medication? They need to know if you have any of these conditions: Eye infection or other eye problems Glaucoma Heart disease Liver disease Lung or breathing disease, such as asthma An unusual or allergic reaction to pilocarpine, other medications, foods, dyes, or preservatives Pregnant or trying to get pregnant Breast-feeding How should I use this medication? Take this medication by mouth with a full glass of water. Take it as directed on the prescription label at the same time every day. Keep taking it unless your care team tells you to stop. Talk to your care team about the use of this medication in children. Special care may be needed. Overdosage: If you think you have taken too much of this medicine contact a poison control center or emergency room at once. NOTE: This medicine is only for you. Do not share this medicine with others. What if I miss a dose? If you miss a dose, take it as soon as you can. If it is almost time for your next dose, take only that dose. Do not take double or extra doses. What may interact with this medication? Antihistamines for allergy, cough, and cold Atropine Certain medications for Alzheimer disease, such as donepezil, galantamine, rivastigmine Certain medications for bladder problems, such as bethanechol, oxybutynin, tolterodine Certain medications for Parkinson disease, such as benztropine, trihexyphenidyl Certain medications for quitting smoking, such as nicotine Certain medications for stomach problems, such as dicyclomine, hyoscyamine Certain medications for  travel sickness, such as scopolamine Ipratropium Medications for blood pressure or heart problems, such as metoprolol This list may not describe all possible interactions. Give your health care provider a list of all the medicines, herbs, non-prescription drugs, or dietary supplements you use. Also tell them if you smoke, drink alcohol, or use illegal drugs. Some items may interact with your medicine. What should I watch for while using this medication? Visit your care team for regular checks on your progress. Tell your care team if your symptoms do not get better or if they get worse. You may get blurry vision or have trouble telling how far something is from you. This may be a problem at night or when the lights are low. Do not drive, use machinery, or do anything that needs clear vision until you know how this medication affects you. If you sweat a lot, drink enough to replace fluids. Do not get dehydrated. What side effects may I notice from receiving this medication? Side effects that you should report to your care team as soon as possible: Allergic reactions--skin rash, itching, hives, swelling of the face, lips, tongue, or throat Fast or irregular heartbeat Increase in blood pressure Low blood pressure--dizziness, feeling faint or lightheaded, blurry vision Slow heartbeat--dizziness, feeling faint or lightheaded, confusion, trouble breathing, unusual weakness or fatigue Side effects that usually do not require medical attention (report to your care team if they continue or are bothersome): Change in vision Chills Diarrhea Excessive sweating Flushing Headache Increased need to urinate This list may not describe all possible side effects. Call your doctor for medical advice about side effects. You may report side effects to FDA at 1-800-FDA-1088.  Where should I keep my medication? Keep out of the reach of children and pets. Store at room temperature between 15 and 30 degrees C (59 and  86 degrees F). Get rid of any unused medication after the expiration date. To get rid of medications that are no longer needed or have expired: Take the medications to a medication take-back program. Check with your pharmacy or law enforcement to find a location. If your cannot return the medication, check the label or package insert to see if the medication should be thrown out in the garbage or flushed down the toilet. If you are not sure, ask your care team. If it is safe to put it in the trash, take the medication out of the container. Mix the medication with cat litter, dirt, coffee grounds, or other unwanted substance. Seal the mixture in a bag or container. Put it in the trash. NOTE: This sheet is a summary. It may not cover all possible information. If you have questions about this medicine, talk to your doctor, pharmacist, or health care provider.  2023 Elsevier/Gold Standard (2021-05-18 00:00:00)   Vaccines You are taking a medication(s) that can suppress your immune system.  The following immunizations are recommended: Flu annually Covid-19  Td/Tdap (tetanus, diphtheria, pertussis) every 10 years Pneumonia (Prevnar 15 then Pneumovax 23 at least 1 year apart.  Alternatively, can take Prevnar 20 without needing additional dose) Shingrix: 2 doses from 4 weeks to 6 months apart  Please check with your PCP to make sure you are up to date.

## 2022-05-25 LAB — CBC WITH DIFFERENTIAL/PLATELET
Absolute Monocytes: 395 cells/uL (ref 200–950)
Basophils Absolute: 30 cells/uL (ref 0–200)
Basophils Relative: 0.8 %
Eosinophils Absolute: 141 cells/uL (ref 15–500)
Eosinophils Relative: 3.7 %
HCT: 35.3 % (ref 35.0–45.0)
Hemoglobin: 12.1 g/dL (ref 11.7–15.5)
Lymphs Abs: 1030 cells/uL (ref 850–3900)
MCH: 31.3 pg (ref 27.0–33.0)
MCHC: 34.3 g/dL (ref 32.0–36.0)
MCV: 91.5 fL (ref 80.0–100.0)
MPV: 10.7 fL (ref 7.5–12.5)
Monocytes Relative: 10.4 %
Neutro Abs: 2204 cells/uL (ref 1500–7800)
Neutrophils Relative %: 58 %
Platelets: 236 10*3/uL (ref 140–400)
RBC: 3.86 10*6/uL (ref 3.80–5.10)
RDW: 12.8 % (ref 11.0–15.0)
Total Lymphocyte: 27.1 %
WBC: 3.8 10*3/uL (ref 3.8–10.8)

## 2022-05-25 LAB — COMPLETE METABOLIC PANEL WITH GFR
AG Ratio: 1.5 (calc) (ref 1.0–2.5)
ALT: 15 U/L (ref 6–29)
AST: 14 U/L (ref 10–35)
Albumin: 4.3 g/dL (ref 3.6–5.1)
Alkaline phosphatase (APISO): 65 U/L (ref 37–153)
BUN: 16 mg/dL (ref 7–25)
CO2: 26 mmol/L (ref 20–32)
Calcium: 9.3 mg/dL (ref 8.6–10.4)
Chloride: 105 mmol/L (ref 98–110)
Creat: 0.89 mg/dL (ref 0.50–1.05)
Globulin: 2.9 g/dL (calc) (ref 1.9–3.7)
Glucose, Bld: 90 mg/dL (ref 65–99)
Potassium: 4.4 mmol/L (ref 3.5–5.3)
Sodium: 140 mmol/L (ref 135–146)
Total Bilirubin: 0.4 mg/dL (ref 0.2–1.2)
Total Protein: 7.2 g/dL (ref 6.1–8.1)
eGFR: 73 mL/min/{1.73_m2} (ref >=60)

## 2022-05-25 LAB — PROTEIN ELECTROPHORESIS, SERUM, WITH REFLEX
Albumin ELP: 4.2 g/dL (ref 3.8–4.8)
Alpha 1: 0.3 g/dL (ref 0.2–0.3)
Alpha 2: 0.6 g/dL (ref 0.5–0.9)
Beta 2: 0.3 g/dL (ref 0.2–0.5)
Beta Globulin: 0.4 g/dL (ref 0.4–0.6)
Gamma Globulin: 1.2 g/dL (ref 0.8–1.7)
Total Protein: 7 g/dL (ref 6.1–8.1)

## 2022-05-25 LAB — PROTEIN / CREATININE RATIO, URINE
Creatinine, Urine: 92 mg/dL (ref 20–275)
Protein/Creat Ratio: 54 mg/g creat (ref 24–184)
Protein/Creatinine Ratio: 0.054 mg/mg creat (ref 0.024–0.184)
Total Protein, Urine: 5 mg/dL (ref 5–24)

## 2022-05-25 LAB — ANTI-DNA ANTIBODY, DOUBLE-STRANDED: ds DNA Ab: <1 IU/mL

## 2022-05-25 LAB — SJOGRENS SYNDROME-B EXTRACTABLE NUCLEAR ANTIBODY: SSB (La) (ENA) Antibody, IgG: <1 AI

## 2022-05-25 LAB — C3 AND C4
C3 Complement: 119 mg/dL (ref 83–193)
C4 Complement: 28 mg/dL (ref 15–57)

## 2022-05-25 LAB — SJOGRENS SYNDROME-A EXTRACTABLE NUCLEAR ANTIBODY: SSA (Ro) (ENA) Antibody, IgG: >8 AI — AB

## 2022-05-25 LAB — SEDIMENTATION RATE: Sed Rate: 14 mm/h (ref 0–30)

## 2022-05-26 NOTE — Progress Notes (Signed)
Double-stranded DNA, anti-SSA, anti-SSB antibodies are negative.  SPEP normal.

## 2022-07-26 ENCOUNTER — Other Ambulatory Visit: Payer: Self-pay | Admitting: Rheumatology

## 2022-07-26 DIAGNOSIS — M359 Systemic involvement of connective tissue, unspecified: Secondary | ICD-10-CM

## 2022-07-29 MED ORDER — HYDROXYCHLOROQUINE SULFATE 200 MG PO TABS: 200.0000 mg | ORAL_TABLET | Freq: Every day | ORAL | 0 refills | Status: DC

## 2022-07-29 NOTE — Telephone Encounter (Signed)
Next Visit: 10/29/2022  Last Visit: 05/22/2022  Labs: 05/22/2022  CBC and CMP WNL. ESR WNL. Complements WNL. Protein creatinine ratio WNL Double-stranded DNA, anti-SSA, anti-SSB antibodies are negative.  SPEP normal.   Eye exam: 06/26/2022   Current Dose per office note 05/22/2022: - Plaquenil 200 mg 1 tablet by mouth daily.   DX: Autoimmune disease   Last Fill: 04/23/2022  Okay to refill Plaquenil?

## 2022-10-22 NOTE — Progress Notes (Signed)
Office Visit Note  Patient: Suzanne [name]             Date of Birth: 1961-05-29           MRN: [id number]             PCP: Marylen Ponto, MD Referring: Marylen Ponto, MD Visit Date: 11/05/2022 Occupation: @GUAROCC @  Subjective:  Medication management  History of Present Illness: Jaelynn Colom is a 62 y.o. female with history of Sjogren's, osteoarthritis and psoriasis.  She states she continues to have dry eyes and dry skin.  She has not noticed much dry mouth symptoms.  She has been using eyedrops.  She also had eye bugging in the past which did not work.  She continues to have some stiffness in her joints but denies any history of joint swelling.  She denies history of oral ulcers, nasal ulcers, malar rash, Raynaud's or lymphadenopathy.  She recently went to [place] and developed some redness on her face and arms after the trip.  She has not had any recent lesions of psoriasis.    Activities of Daily Living:  Patient reports morning stiffness for 30 minutes.   Patient Denies nocturnal pain.  Difficulty dressing/grooming: Denies Difficulty climbing stairs: Denies Difficulty getting out of chair: Denies Difficulty using hands for taps, buttons, cutlery, and/or writing: Denies  Review of Systems  Constitutional:  Positive for fatigue.  HENT:  Negative for mouth sores and mouth dryness.   Eyes:  Positive for dryness.  Respiratory:  Negative for shortness of breath.   Cardiovascular:  Positive for swelling in legs/feet. Negative for chest pain and palpitations.  Gastrointestinal:  Positive for constipation and diarrhea. Negative for blood in stool.  Endocrine: Negative for increased urination.  Genitourinary:  Negative for involuntary urination.  Musculoskeletal:  Positive for morning stiffness. Negative for joint pain, gait problem, joint pain, joint swelling, myalgias, muscle weakness, muscle tenderness and myalgias.  Skin:  Positive for sensitivity to sunlight.  Negative for color change, rash and hair loss.  Allergic/Immunologic: Negative for susceptible to infections.  Neurological:  Positive for headaches. Negative for dizziness.  Hematological:  Negative for swollen glands.  Psychiatric/Behavioral:  Negative for depressed mood and sleep disturbance. The patient is not nervous/anxious.     PMFS History:  Patient Active Problem List   Diagnosis Date Noted   Autoimmune disease (HCC) 08/26/2016   High risk medication use 08/26/2016   Psoriasis 08/26/2016   Primary osteoarthritis of both hands 08/26/2016   History of Sjogren's disease (HCC) 08/26/2016   Pain, joint, knee, left 08/26/2016   History of hypertension 08/26/2016   H/O rosacea 08/26/2016    Past Medical History:  Diagnosis Date   Autoimmune disease (HCC)    Hypertension     Family History  Problem Relation Age of Onset   Hypertension Mother    Stroke Mother    Hypercholesterolemia Mother    Cancer Father    Clotting disorder Daughter    Hashimoto's thyroiditis Daughter    Rheum arthritis Daughter    Past Surgical History:  Procedure Laterality Date   TOOTH EXTRACTION  06/13/2021   Social History   Social History Narrative   Not on file   Immunization History  Administered Date(s) Administered   Influenza,inj,Quad PF,6+ Mos 03/23/2017   Janssen (J&J) SARS-COV-2 Vaccination 09/22/2019   PFIZER(Purple Top)SARS-COV-2 Vaccination 04/23/2020     Objective: Vital Signs: BP (!) 181/90 (BP Location: Left Arm, Patient Position: Sitting, Cuff Size: Normal)  Pulse (!) 58   Resp 14   Ht 5\' 7"  (1.702 m)   Wt 173 lb 12.8 oz (78.8 kg)   BMI 27.22 kg/m    Physical Exam Vitals and nursing note reviewed.  Constitutional:      Appearance: She is well-developed.  HENT:     Head: Normocephalic and atraumatic.  Eyes:     Conjunctiva/sclera: Conjunctivae normal.  Cardiovascular:     Rate and Rhythm: Normal rate and regular rhythm.     Heart sounds: Normal heart sounds.   Pulmonary:     Effort: Pulmonary effort is normal.     Breath sounds: Normal breath sounds.  Abdominal:     General: Bowel sounds are normal.     Palpations: Abdomen is soft.  Musculoskeletal:     Cervical back: Normal range of motion.  Lymphadenopathy:     Cervical: No cervical adenopathy.  Skin:    General: Skin is warm and dry.     Capillary Refill: Capillary refill takes less than 2 seconds.  Neurological:     Mental Status: She is alert and oriented to person, place, and time.  Psychiatric:        Behavior: Behavior normal.      Musculoskeletal Exam: Cervical, thoracic and lumbar spine were in good range of motion.  She had difficulty reaching her toes due to tight hamstrings.  Shoulders, elbows, wrist joints, MCPs PIPs and DIPs were in good range of motion.  She had bilateral PIP and DIP thickening with no synovitis.  Hip joints and knee joints were in good range of motion.  She had no tenderness over ankles or MTPs.  CDAI Exam: CDAI Score: -- Patient Global: --; Provider Global: -- Swollen: --; Tender: -- Joint Exam 11/05/2022   No joint exam has been documented for this visit   There is currently no information documented on the homunculus. Go to the Rheumatology activity and complete the homunculus joint exam.  Investigation: No additional findings.  Imaging: No results found.  Recent Labs: Lab Results  Component Value Date   WBC 3.8 05/22/2022   HGB 12.1 05/22/2022   PLT 236 05/22/2022   NA 140 05/22/2022   K 4.4 05/22/2022   CL 105 05/22/2022   CO2 26 05/22/2022   GLUCOSE 90 05/22/2022   BUN 16 05/22/2022   CREATININE 0.89 05/22/2022   BILITOT 0.4 05/22/2022   ALKPHOS 59 08/05/2016   AST 14 05/22/2022   ALT 15 05/22/2022   PROT 7.2 05/22/2022   PROT 7.0 05/22/2022   ALBUMIN 4.4 08/05/2016   CALCIUM 9.3 05/22/2022   GFRAA 85 07/27/2020    Speciality Comments: Plaquenil eye exam normal on 06/26/2022 WNL @ [facility] Follow up 1  year  Procedures:  No procedures performed Allergies: Patient has no known allergies.   Assessment / Plan:     Visit Diagnoses: Sjogren's syndrome with keratoconjunctivitis sicca (HCC) - +ANA, +Ro, +La,RF- h/o sicca symptoms, fatigue, neutropenia: -She continues to have positive ANA, positive Ro antibody.  Low antibody has been negative.  Rheumatoid factor has been negative.  She has history of dry mouth and dry skin.  She denies any history of dry mouth.  She also gives history of photosensitivity.  No skin tightness or sclerodactyly was noted.  She declined pilocarpine in the past.  She has been using over-the-counter products which has been helpful.  I will obtain labs today.  Plan: Protein / creatinine ratio, urine, Anti-DNA antibody, double-stranded, C3 and C4, ANA,  Sedimentation rate, Urinalysis, Routine w reflex microscopic, Sjogrens syndrome-A extractable nuclear antibody, hydroxychloroquine (PLAQUENIL) 200 MG tablet  High risk medication use - Plaquenil 200 mg 1 tablet by mouth daily. Plaquenil eye exam normal on 06/26/2022 -Labs obtained on May 22, 2022 CBC and CMP were normal.  Sed rate was normal.  Complements was normal.  Double-stranded DNA negative, SPEP normal urine protein creatinine ratio was normal.  Plan: CBC with Differential/Platelet, COMPLETE METABOLIC PANEL WITH GFR today and then every 5 months.  Primary osteoarthritis of both hands-she has osteoarthritis involving bilateral hands with PIP and DIP thickening.  Joint protection muscle strengthening was discussed.  No synovitis was noted.  Primary osteoarthritis of both feet-she has chronic discomfort in her feet.  Proper fitting shoes were advised.  Ischial bursitis of left side-she has recurrent ischial bursitis.  Stretching exercises and cushion was advised.  Other fatigue -she continues to have some fatigue.  Psoriasis-she had no active psoriasis lesions.  H/O rosacea-she has redness on her face.  She is followed by  dermatologist.  History of hypertension-her blood pressure was elevated at 181/90 today.  Repeat blood pressure was also elevated.  She was advised to monitor blood pressure closely and follow-up with her PCP.  Vitamin D deficiency - Her last DEXA scan at age 5 was within normal limits per patient  Orders: Orders Placed This Encounter  Procedures   Protein / creatinine ratio, urine   CBC with Differential/Platelet   COMPLETE METABOLIC PANEL WITH GFR   Anti-DNA antibody, double-stranded   C3 and C4   ANA   Sedimentation rate   Urinalysis, Routine w reflex microscopic   Sjogrens syndrome-A extractable nuclear antibody   Meds ordered this encounter  Medications   hydroxychloroquine (PLAQUENIL) 200 MG tablet    Sig: Take 1 tablet (200 mg total) by mouth daily.    Dispense:  90 tablet    Refill:  0     Follow-Up Instructions: Return in about 5 months (around 04/07/2023) for Sjogren's.   Pollyann Savoy, MD  Note - This record has been created using Animal nutritionist.  Chart creation errors have been sought, but may not always  have been located. Such creation errors do not reflect on  the standard of medical care.

## 2022-10-29 ENCOUNTER — Ambulatory Visit: Payer: 59 | Admitting: Rheumatology

## 2022-11-05 ENCOUNTER — Encounter: Payer: Self-pay | Admitting: Rheumatology

## 2022-11-05 ENCOUNTER — Ambulatory Visit: Payer: 59 | Attending: Rheumatology | Admitting: Rheumatology

## 2022-11-05 VITALS — BP 181/90 | HR 58 | Resp 14 | Ht 67.0 in | Wt 173.8 lb

## 2022-11-05 DIAGNOSIS — M19041 Primary osteoarthritis, right hand: Secondary | ICD-10-CM

## 2022-11-05 DIAGNOSIS — M19072 Primary osteoarthritis, left ankle and foot: Secondary | ICD-10-CM

## 2022-11-05 DIAGNOSIS — M19042 Primary osteoarthritis, left hand: Secondary | ICD-10-CM

## 2022-11-05 DIAGNOSIS — M359 Systemic involvement of connective tissue, unspecified: Secondary | ICD-10-CM

## 2022-11-05 DIAGNOSIS — M7072 Other bursitis of hip, left hip: Secondary | ICD-10-CM

## 2022-11-05 DIAGNOSIS — M3501 Sicca syndrome with keratoconjunctivitis: Secondary | ICD-10-CM

## 2022-11-05 DIAGNOSIS — M19071 Primary osteoarthritis, right ankle and foot: Secondary | ICD-10-CM | POA: Diagnosis not present

## 2022-11-05 DIAGNOSIS — E559 Vitamin D deficiency, unspecified: Secondary | ICD-10-CM

## 2022-11-05 DIAGNOSIS — L409 Psoriasis, unspecified: Secondary | ICD-10-CM

## 2022-11-05 DIAGNOSIS — Z8679 Personal history of other diseases of the circulatory system: Secondary | ICD-10-CM

## 2022-11-05 DIAGNOSIS — R5383 Other fatigue: Secondary | ICD-10-CM

## 2022-11-05 DIAGNOSIS — Z872 Personal history of diseases of the skin and subcutaneous tissue: Secondary | ICD-10-CM

## 2022-11-05 DIAGNOSIS — Z79899 Other long term (current) drug therapy: Secondary | ICD-10-CM | POA: Diagnosis not present

## 2022-11-05 MED ORDER — HYDROXYCHLOROQUINE SULFATE 200 MG PO TABS: 200.0000 mg | ORAL_TABLET | Freq: Every day | ORAL | 0 refills | Status: DC

## 2022-11-05 NOTE — Patient Instructions (Signed)
Vaccines You are taking a medication(s) that can suppress your immune system.  The following immunizations are recommended: Flu annually Covid-19  Td/Tdap (tetanus, diphtheria, pertussis) every 10 years Pneumonia (Prevnar 15 then Pneumovax 23 at least 1 year apart.  Alternatively, can take Prevnar 20 without needing additional dose) Shingrix: 2 doses from 4 weeks to 6 months apart  Please check with your PCP to make sure you are up to date.  

## 2022-11-06 LAB — URINALYSIS, ROUTINE W REFLEX MICROSCOPIC
Bilirubin Urine: NEGATIVE
Glucose, UA: NEGATIVE
Specific Gravity, Urine: 1.009 (ref 1.001–1.035)
pH: 6 (ref 5.0–8.0)

## 2022-11-07 LAB — CBC WITH DIFFERENTIAL/PLATELET
Absolute Monocytes: 422 cells/uL (ref 200–950)
Basophils Absolute: 29 cells/uL (ref 0–200)
Basophils Relative: 0.7 %
Eosinophils Absolute: 103 cells/uL (ref 15–500)
Eosinophils Relative: 2.5 %
HCT: 37 % (ref 35.0–45.0)
Hemoglobin: 12.6 g/dL (ref 11.7–15.5)
Lymphs Abs: 1050 cells/uL (ref 850–3900)
MCH: 30.8 pg (ref 27.0–33.0)
MCHC: 34.1 g/dL (ref 32.0–36.0)
MCV: 90.5 fL (ref 80.0–100.0)
MPV: 10.7 fL (ref 7.5–12.5)
Monocytes Relative: 10.3 %
Neutro Abs: 2497 cells/uL (ref 1500–7800)
Neutrophils Relative %: 60.9 %
Platelets: 216 10*3/uL (ref 140–400)
RBC: 4.09 10*6/uL (ref 3.80–5.10)
RDW: 12.7 % (ref 11.0–15.0)
Total Lymphocyte: 25.6 %
WBC: 4.1 10*3/uL (ref 3.8–10.8)

## 2022-11-07 LAB — COMPLETE METABOLIC PANEL WITH GFR
AG Ratio: 1.7 (calc) (ref 1.0–2.5)
ALT: 26 U/L (ref 6–29)
AST: 20 U/L (ref 10–35)
Albumin: 4.5 g/dL (ref 3.6–5.1)
Alkaline phosphatase (APISO): 65 U/L (ref 37–153)
BUN/Creatinine Ratio: 19 (calc) (ref 6–22)
BUN: 22 mg/dL (ref 7–25)
CO2: 27 mmol/L (ref 20–32)
Calcium: 9.7 mg/dL (ref 8.6–10.4)
Chloride: 103 mmol/L (ref 98–110)
Creat: 1.16 mg/dL — ABNORMAL HIGH (ref 0.50–1.05)
Globulin: 2.7 g/dL (calc) (ref 1.9–3.7)
Glucose, Bld: 91 mg/dL (ref 65–99)
Potassium: 3.9 mmol/L (ref 3.5–5.3)
Sodium: 140 mmol/L (ref 135–146)
Total Bilirubin: 0.6 mg/dL (ref 0.2–1.2)
Total Protein: 7.2 g/dL (ref 6.1–8.1)
eGFR: 53 mL/min/{1.73_m2} — ABNORMAL LOW (ref >=60)

## 2022-11-07 LAB — URINALYSIS, ROUTINE W REFLEX MICROSCOPIC
Hgb urine dipstick: NEGATIVE
Ketones, ur: NEGATIVE
Leukocytes,Ua: NEGATIVE
Nitrite: NEGATIVE
Protein, ur: NEGATIVE

## 2022-11-07 LAB — C3 AND C4
C3 Complement: 126 mg/dL (ref 83–193)
C4 Complement: 27 mg/dL (ref 15–57)

## 2022-11-07 LAB — SJOGRENS SYNDROME-A EXTRACTABLE NUCLEAR ANTIBODY: SSA (Ro) (ENA) Antibody, IgG: >8 AI — AB

## 2022-11-07 LAB — SEDIMENTATION RATE: Sed Rate: 11 mm/h (ref 0–30)

## 2022-11-07 LAB — PROTEIN / CREATININE RATIO, URINE
Creatinine, Urine: 50 mg/dL (ref 20–275)
Total Protein, Urine: <4 mg/dL — ABNORMAL LOW (ref 5–24)

## 2022-11-07 LAB — ANTI-DNA ANTIBODY, DOUBLE-STRANDED: ds DNA Ab: <1 IU/mL

## 2022-11-07 LAB — ANTI-NUCLEAR AB-TITER (ANA TITER)
ANA TITER: 1:80 {titer} — ABNORMAL HIGH
ANA Titer 1: 1:80 {titer} — ABNORMAL HIGH

## 2022-11-07 LAB — ANA: Anti Nuclear Antibody (ANA): POSITIVE — AB

## 2022-11-07 NOTE — Progress Notes (Signed)
Urine protein creatinine ratio is normal, creatinine is mildly elevated (most likely due to diuretic use), SSA antibody is positive and stable, CBC normal, double-stranded DNA negative, complements normal, sed rate normal, UA negative.  ANA is pending.  Labs do not indicate an active autoimmune disease.

## 2022-11-12 NOTE — Progress Notes (Signed)
ANA is low titer positive and stable.

## 2023-01-31 ENCOUNTER — Other Ambulatory Visit: Payer: Self-pay | Admitting: Rheumatology

## 2023-01-31 DIAGNOSIS — M3501 Sicca syndrome with keratoconjunctivitis: Secondary | ICD-10-CM

## 2023-01-31 MED ORDER — HYDROXYCHLOROQUINE SULFATE 200 MG PO TABS: 200.0000 mg | ORAL_TABLET | Freq: Every day | ORAL | 0 refills | Status: DC

## 2023-01-31 NOTE — Telephone Encounter (Signed)
Last Fill: 11/05/2022  Eye exam: 06/26/2022 WNL   Labs: 11/05/2022 CBC normal, creatinine is mildly elevated (most likely due to diuretic use)   Next Visit: 04/07/2023  Last Visit: 11/05/2022  UY:[id number] syndrome with keratoconjunctivitis sicca   Current Dose per office note 11/05/2022: Plaquenil 200 mg 1 tablet by mouth daily.   Okay to refill Plaquenil?

## 2023-03-17 LAB — LAB REPORT - SCANNED: EGFR: 60

## 2023-03-25 NOTE — Progress Notes (Signed)
Office Visit Note  Patient: Suzanne [name]             Date of Birth: August 19, 1960           MRN: [id number]             PCP: Marylen Ponto, MD Referring: Marylen Ponto, MD Visit Date: 04/07/2023 Occupation: @GUAROCC @  Subjective:  Medication monitoring   History of Present Illness: Suzanne [name] is a 62 y.o. female with history of sjogren's syndrome and osteoarthritis.  Patient remains on Plaquenil 200 mg 1 tablet by mouth daily.  She continues to tolerate Plaquenil without any side effects and has not missed any doses recently.  She has chronic eye dryness and nose dryness but denies any mouth dryness at this time.  She has been using Systane eyedrops for symptomatic relief.  She continues to see ophthalmology on a yearly basis and her dentist every 6 months.  She denies any parotid swelling or tenderness.  She denies any swollen lymph nodes.  Her energy level has been stable.  She has been sleeping well at night.  She experiences intermittent discomfort in the left wrist but denies any joint swelling.  She is not having to take any over-the-counter products for pain relief.  She denies any new or worsening pulmonary symptoms.  She denies any recent or recurrent infections.  She denies any new medical conditions.    Activities of Daily Living:  Patient reports morning stiffness for 20-30 minutes.   Patient Denies nocturnal pain.  Difficulty dressing/grooming: Denies Difficulty climbing stairs: Denies Difficulty getting out of chair: Denies Difficulty using hands for taps, buttons, cutlery, and/or writing: Denies  Review of Systems  Constitutional:  Positive for fatigue.  HENT:  Negative for mouth sores and mouth dryness.   Eyes:  Positive for itching and dryness. Negative for photophobia, pain, redness and visual disturbance.  Respiratory:  Negative for cough, shortness of breath and wheezing.   Cardiovascular:  Negative for chest pain and palpitations.  Gastrointestinal:   Positive for constipation and diarrhea. Negative for blood in stool.  Endocrine: Negative for increased urination.  Genitourinary:  Negative for involuntary urination.  Musculoskeletal:  Positive for joint pain, joint pain, myalgias, morning stiffness, muscle tenderness and myalgias. Negative for gait problem, joint swelling and muscle weakness.  Skin:  Positive for sensitivity to sunlight. Negative for color change, rash and hair loss.  Allergic/Immunologic: Negative for susceptible to infections.  Neurological:  Negative for dizziness and headaches.  Hematological:  Negative for swollen glands.  Psychiatric/Behavioral:  Negative for depressed mood and sleep disturbance. The patient is not nervous/anxious.     PMFS History:  Patient Active Problem List   Diagnosis Date Noted   Autoimmune disease (HCC) 08/26/2016   High risk medication use 08/26/2016   Psoriasis 08/26/2016   Primary osteoarthritis of both hands 08/26/2016   History of Sjogren's disease (HCC) 08/26/2016   Pain, joint, knee, left 08/26/2016   History of hypertension 08/26/2016   H/O rosacea 08/26/2016    Past Medical History:  Diagnosis Date   Autoimmune disease (HCC)    Hypertension     Family History  Problem Relation Age of Onset   Hypertension Mother    Stroke Mother    Hypercholesterolemia Mother    Cancer Father    Clotting disorder Daughter    Hashimoto's thyroiditis Daughter    Rheum arthritis Daughter    Past Surgical History:  Procedure Laterality Date   TOOTH EXTRACTION  06/13/2021   Social History   Social History Narrative   Not on file   Immunization History  Administered Date(s) Administered   Influenza,inj,Quad PF,6+ Mos 03/23/2017   Janssen (J&J) SARS-COV-2 Vaccination 09/22/2019   PFIZER(Purple Top)SARS-COV-2 Vaccination 04/23/2020     Objective: Vital Signs: BP (!) 167/101 (BP Location: Left Arm, Patient Position: Sitting, Cuff Size: Normal)   Pulse 62   Resp 15   Ht 5\' 7"   (1.702 m)   Wt 172 lb 12.8 oz (78.4 kg)   BMI 27.06 kg/m    Physical Exam Vitals and nursing note reviewed.  Constitutional:      Appearance: She is well-developed.  HENT:     Head: Normocephalic and atraumatic.  Eyes:     Conjunctiva/sclera: Conjunctivae normal.  Cardiovascular:     Rate and Rhythm: Normal rate and regular rhythm.     Heart sounds: Normal heart sounds.  Pulmonary:     Effort: Pulmonary effort is normal.     Breath sounds: Normal breath sounds.  Abdominal:     General: Bowel sounds are normal.     Palpations: Abdomen is soft.  Musculoskeletal:     Cervical back: Normal range of motion.  Lymphadenopathy:     Cervical: No cervical adenopathy.  Skin:    General: Skin is warm and dry.     Capillary Refill: Capillary refill takes less than 2 seconds.  Neurological:     Mental Status: She is alert and oriented to person, place, and time.  Psychiatric:        Behavior: Behavior normal.      Musculoskeletal Exam: C-spine, thoracic spine, and lumbar spine good ROM.  Shoulder joints, elbow joints, wrist joints, MCPs, PIPs, DIPs have good range of motion with no synovitis.  Complete fist formation bilaterally.  PIP and DIP thickening consistent with osteoarthritis of both hands.  Hip joints have good range of motion with no groin pain.  Knee joints have good range of motion no warmth or effusion.  Ankle joints have good range of motion with no tenderness or joint swelling.  CDAI Exam: CDAI Score: -- Patient Global: --; Provider Global: -- Swollen: --; Tender: -- Joint Exam 04/07/2023   No joint exam has been documented for this visit   There is currently no information documented on the homunculus. Go to the Rheumatology activity and complete the homunculus joint exam.  Investigation: No additional findings.  Imaging: No results found.  Recent Labs: Lab Results  Component Value Date   WBC 4.1 11/05/2022   HGB 12.6 11/05/2022   PLT 216 11/05/2022   NA  140 11/05/2022   K 3.9 11/05/2022   CL 103 11/05/2022   CO2 27 11/05/2022   GLUCOSE 91 11/05/2022   BUN 22 11/05/2022   CREATININE 1.16 (H) 11/05/2022   BILITOT 0.6 11/05/2022   ALKPHOS 59 08/05/2016   AST 20 11/05/2022   ALT 26 11/05/2022   PROT 7.2 11/05/2022   ALBUMIN 4.4 08/05/2016   CALCIUM 9.7 11/05/2022   GFRAA 85 07/27/2020    Speciality Comments: Plaquenil eye exam normal on 06/26/2022 WNL @ [facility] Follow up 1 year  Procedures:  No procedures performed Allergies: Patient has no known allergies.    Assessment / Plan:     Visit Diagnoses: Sjogren's syndrome with keratoconjunctivitis sicca (HCC) - +ANA, +Ro, +La,RF- h/o sicca symptoms, fatigue, neutropenia: Patient continues to have chronic eye dryness and nose dryness.  She has been using Systane eyedrops for symptomatic relief.  She has not noticed any increased mouth dryness.  She does not typically use a humidifier seasonally.  She continues to see the ophthalmologist on a yearly basis and dentist every 6 months.  She remains on Plaquenil 200 mg 1 tablet by mouth daily.  She is tolerating Plaquenil without any side effects and has not missed any doses recently.  She has no synovitis on examination today.  She has not had any new or worsening pulmonary symptoms.  Her lungs were clear to auscultation today.  Discussed the increased risk of developing ILD in patients with Sjogren's syndrome.  Also discussed the increased risk for developing lymphoma in patients with Sjogren's syndrome. Patient had updated lab work including CBC, CMP, lipid panel on 03/17/2023 ordered by her PCP--WBC count was low at 3.1.  Patient was encouraged to have repeat CBC with differential as well as the following autoimmune lab work in 2 to 3 weeks.  Future orders were placed today.  She will remain on Plaquenil as prescribed.  She was vies notify us if she develops any new or worsening symptoms.  She will follow-up in the office in 5 months  or sooner if needed.  - Plan: CBC with Differential/Platelet, C3 and C4, Sedimentation rate, Urinalysis, Routine w reflex microscopic, Serum protein electrophoresis with reflex, hydroxychloroquine (PLAQUENIL) 200 MG tablet  High risk medication use - Plaquenil 200 mg 1 tablet by mouth daily.  Plaquenil eye exam normal on 06/26/2022 WNL @ [facility] Follow up 1 year  CBC and CMP updated on 03/17/2023: White blood cell count 3.1, red blood cell count 3.76, hemoglobin 11.9, hematocrit 33%, platelets 201, creatinine 0.9, GFR greater than 60, AST within normal limits, ALT within normal limits. Advised patient to have updated lab work in 2 to 3 weeks.  Future order for CBC with differential placed today.  - Plan: CBC with Differential/Platelet  History of neutropenia - Future order for CBC with diff placed today. Plan: CBC with Differential/Platelet  Primary osteoarthritis of both hands: PIP and DIP thickening consistent with osteoarthritis of both hands.  No tenderness or synovitis noted.  Primary osteoarthritis of both feet: She has not been experiencing any increased discomfort in her feet at this time.  She has good range of motion of both ankle joints with no tenderness or joint swelling.  Other medical conditions are listed as follows:  Ischial bursitis of left side  Other fatigue: Stable.  Psoriasis  H/O rosacea  History of hypertension: Blood pressure was elevated today in the office and was rechecked prior to leaving.  Patient was advised to monitor blood pressure closely and reach out to PCP if remains elevated.  Vitamin D deficiency   Orders: Orders Placed This Encounter  Procedures   CBC with Differential/Platelet   C3 and C4   Sedimentation rate   Urinalysis, Routine w reflex microscopic   Serum protein electrophoresis with reflex   Meds ordered this encounter  Medications   hydroxychloroquine (PLAQUENIL) 200 MG tablet    Sig: Take 1 tablet (200 mg total)  by mouth daily.    Dispense:  90 tablet    Refill:  0    Follow-Up Instructions: Return in about 5 months (around 09/05/2023) for Sjogren's syndrome.   Gearldine Bienenstock, PA-C  Note - This record has been created using Dragon software.  Chart creation errors have been sought, but may not always  have been located. Such creation errors do not reflect on  the standard of medical care.

## 2023-04-07 ENCOUNTER — Ambulatory Visit: Payer: 59 | Attending: Physician Assistant | Admitting: Physician Assistant

## 2023-04-07 ENCOUNTER — Encounter: Payer: Self-pay | Admitting: Physician Assistant

## 2023-04-07 VITALS — BP 167/101 | HR 62 | Resp 15 | Ht 67.0 in | Wt 172.8 lb

## 2023-04-07 DIAGNOSIS — M19041 Primary osteoarthritis, right hand: Secondary | ICD-10-CM

## 2023-04-07 DIAGNOSIS — M19072 Primary osteoarthritis, left ankle and foot: Secondary | ICD-10-CM

## 2023-04-07 DIAGNOSIS — L409 Psoriasis, unspecified: Secondary | ICD-10-CM

## 2023-04-07 DIAGNOSIS — Z862 Personal history of diseases of the blood and blood-forming organs and certain disorders involving the immune mechanism: Secondary | ICD-10-CM

## 2023-04-07 DIAGNOSIS — Z872 Personal history of diseases of the skin and subcutaneous tissue: Secondary | ICD-10-CM

## 2023-04-07 DIAGNOSIS — M7072 Other bursitis of hip, left hip: Secondary | ICD-10-CM

## 2023-04-07 DIAGNOSIS — M19042 Primary osteoarthritis, left hand: Secondary | ICD-10-CM

## 2023-04-07 DIAGNOSIS — R5383 Other fatigue: Secondary | ICD-10-CM

## 2023-04-07 DIAGNOSIS — Z79899 Other long term (current) drug therapy: Secondary | ICD-10-CM | POA: Diagnosis not present

## 2023-04-07 DIAGNOSIS — Z8679 Personal history of other diseases of the circulatory system: Secondary | ICD-10-CM

## 2023-04-07 DIAGNOSIS — M3501 Sicca syndrome with keratoconjunctivitis: Secondary | ICD-10-CM | POA: Diagnosis not present

## 2023-04-07 DIAGNOSIS — E559 Vitamin D deficiency, unspecified: Secondary | ICD-10-CM

## 2023-04-07 DIAGNOSIS — M19071 Primary osteoarthritis, right ankle and foot: Secondary | ICD-10-CM | POA: Diagnosis not present

## 2023-04-07 MED ORDER — HYDROXYCHLOROQUINE SULFATE 200 MG PO TABS
200.0000 mg | ORAL_TABLET | Freq: Every day | ORAL | 0 refills | Status: DC
Start: 2023-04-07 — End: 2023-07-04

## 2023-04-07 NOTE — Patient Instructions (Signed)
Standing Labs We placed an order today for your standing lab work.   Please have your standing labs drawn in November  Please have your labs drawn 2 weeks prior to your appointment so that the provider can discuss your lab results at your appointment, if possible.  Please note that you may see your imaging and lab results in MyChart before we have reviewed them. We will contact you once all results are reviewed. Please allow our office up to 72 hours to thoroughly review all of the results before contacting the office for clarification of your results.  WALK-IN LAB HOURS  Monday through Thursday from 8:00 am -12:30 pm and 1:00 pm-5:00 pm and Friday from 8:00 am-12:00 pm.  Patients with office visits requiring labs will be seen before walk-in labs.  You may encounter longer than normal wait times. Please allow additional time. Wait times may be shorter on  Monday and Thursday afternoons.  We do not book appointments for walk-in labs. We appreciate your patience and understanding with our staff.   Labs are drawn by [facility]. Please bring your co-pay at the time of your lab draw.  You may receive a bill from [facility] for your lab work.  Please note if you are on Hydroxychloroquine and and an order has been placed for a Hydroxychloroquine level,  you will need to have it drawn 4 hours or more after your last dose.  If you wish to have your labs drawn at another location, please call the office 24 hours in advance so we can fax the orders.  The office is located at [address], [place], [place] [address]   If you have any questions regarding directions or hours of operation,  please call [phone].   As a reminder, please drink plenty of water prior to coming for your lab work. Thanks!

## 2023-06-25 ENCOUNTER — Telehealth: Payer: Self-pay | Admitting: *Deleted

## 2023-06-25 NOTE — Telephone Encounter (Signed)
 Labs received from: [facility]  Drawn on: 06/16/2023  Reviewed by: Dr. Maya Nash   Labs drawn: CBC, C3 and C4, UA, SPEP, ESR  Results: WBC 3.5   RBC 4.07   UA: WBC 6-10           Bacteria Many           WBC Esterase Trace  Patient is on PLQ 200 mg po daily.

## 2023-07-04 ENCOUNTER — Other Ambulatory Visit: Payer: Self-pay | Admitting: *Deleted

## 2023-07-04 DIAGNOSIS — M3501 Sicca syndrome with keratoconjunctivitis: Secondary | ICD-10-CM

## 2023-07-04 MED ORDER — HYDROXYCHLOROQUINE SULFATE 200 MG PO TABS
200.0000 mg | ORAL_TABLET | Freq: Every day | ORAL | 0 refills | Status: DC
Start: 2023-07-04 — End: 2023-10-03

## 2023-07-04 NOTE — Telephone Encounter (Signed)
Last Fill: 04/07/2023  Eye exam: 07/02/2023 WNL    Labs: 06/16/2023 WBC 3.5   RBC 4.07  Next Visit: 09/11/2023  Last Visit: 04/07/2023  DX: Sjogren's syndrome with keratoconjunctivitis sicca   Current Dose per office note 04/07/2023: Plaquenil 200 mg 1 tablet by mouth daily   Okay to refill Plaquenil?

## 2023-08-29 NOTE — Progress Notes (Signed)
 Office Visit Note  Patient: Suzanne [name]             Date of Birth: Jun 03, 1961           MRN: [id number]             PCP: Marylen Ponto, MD Referring: Marylen Ponto, MD Visit Date: 09/11/2023 Occupation: @GUAROCC @  Subjective:  Medication management  History of Present Illness: Suzanne [name] is a 63 y.o. female with Sjogren's, and osteoarthritis returns for follow-up visit after her last visit in October 2024.  She continues to have dry eyes symptoms for which she has been using over-the-counter products.  She does not have much dry mouth symptoms.  She denies any joint swelling.  There is no history of shortness of breath or lymphadenopathy.  She is on Plaquenil 200 mg p.o. daily.  She denies interruption in the treatment.    Activities of Daily Living:  Patient reports morning stiffness for 20-30 minutes.   Patient Denies nocturnal pain.  Difficulty dressing/grooming: Denies Difficulty climbing stairs: Denies Difficulty getting out of chair: Denies Difficulty using hands for taps, buttons, cutlery, and/or writing: Denies  Review of Systems  Constitutional:  Positive for fatigue.  HENT:  Negative for mouth sores and mouth dryness.   Eyes:  Positive for dryness.  Respiratory:  Negative for shortness of breath.   Cardiovascular:  Negative for chest pain and palpitations.  Gastrointestinal:  Positive for constipation and diarrhea. Negative for blood in stool.  Endocrine: Negative for increased urination.  Genitourinary:  Negative for involuntary urination.  Musculoskeletal:  Positive for myalgias, muscle weakness, morning stiffness, muscle tenderness and myalgias. Negative for joint pain, gait problem, joint pain and joint swelling.  Skin:  Negative for color change, rash, hair loss and sensitivity to sunlight.  Allergic/Immunologic: Negative for susceptible to infections.  Neurological:  Negative for dizziness and headaches.  Hematological:  Negative for swollen  glands.  Psychiatric/Behavioral:  Negative for depressed mood and sleep disturbance. The patient is not nervous/anxious.     PMFS History:  Patient Active Problem List   Diagnosis Date Noted   Autoimmune disease (HCC) 08/26/2016   High risk medication use 08/26/2016   Psoriasis 08/26/2016   Primary osteoarthritis of both hands 08/26/2016   History of Sjogren's disease (HCC) 08/26/2016   Pain, joint, knee, left 08/26/2016   History of hypertension 08/26/2016   H/O rosacea 08/26/2016    Past Medical History:  Diagnosis Date   Autoimmune disease (HCC)    Hypertension     Family History  Problem Relation Age of Onset   Hypertension Mother    Stroke Mother    Hypercholesterolemia Mother    Cancer Father    Clotting disorder Daughter    Hashimoto's thyroiditis Daughter    Rheum arthritis Daughter    Past Surgical History:  Procedure Laterality Date   TOOTH EXTRACTION  06/13/2021   Social History   Social History Narrative   Not on file   Immunization History  Administered Date(s) Administered   Influenza,inj,Quad PF,6+ Mos 03/23/2017   Janssen (J&J) SARS-COV-2 Vaccination 09/22/2019   PFIZER(Purple Top)SARS-COV-2 Vaccination 04/23/2020     Objective: Vital Signs: BP (!) 166/81 (BP Location: Left Arm, Patient Position: Sitting, Cuff Size: Normal)   Pulse 61   Resp 13   Ht 5\' 7"  (1.702 m)   Wt 175 lb 6.4 oz (79.6 kg)   BMI 27.47 kg/m    Physical Exam Vitals and nursing note reviewed.  Constitutional:  Appearance: She is well-developed.  HENT:     Head: Normocephalic and atraumatic.  Eyes:     Conjunctiva/sclera: Conjunctivae normal.  Cardiovascular:     Rate and Rhythm: Normal rate and regular rhythm.     Heart sounds: Normal heart sounds.  Pulmonary:     Effort: Pulmonary effort is normal.     Breath sounds: Normal breath sounds.  Abdominal:     General: Bowel sounds are normal.     Palpations: Abdomen is soft.  Musculoskeletal:     Cervical  back: Normal range of motion.  Lymphadenopathy:     Cervical: No cervical adenopathy.  Skin:    General: Skin is warm and dry.     Capillary Refill: Capillary refill takes less than 2 seconds.     Comments: Facial erythema was noted  Neurological:     Mental Status: She is alert and oriented to person, place, and time.  Psychiatric:        Behavior: Behavior normal.      Musculoskeletal Exam: Cervical, thoracic and lumbar spine 1 good range of motion.  Shoulders, elbows, wrist joints, MCPs PIPs and DIPs with good range of motion with no synovitis.  Bilateral PIP and DIP thickening was noted.  Hip joints and knee joints in good range of motion without any warmth swelling or effusion.  There was no tenderness over ankles or MTPs.  CDAI Exam: CDAI Score: -- Patient Global: --; Provider Global: -- Swollen: --; Tender: -- Joint Exam 09/11/2023   No joint exam has been documented for this visit   There is currently no information documented on the homunculus. Go to the Rheumatology activity and complete the homunculus joint exam.  Investigation: No additional findings.  Imaging: No results found.  Recent Labs: Lab Results  Component Value Date   WBC 4.1 11/05/2022   HGB 12.6 11/05/2022   PLT 216 11/05/2022   NA 140 11/05/2022   K 3.9 11/05/2022   CL 103 11/05/2022   CO2 27 11/05/2022   GLUCOSE 91 11/05/2022   BUN 22 11/05/2022   CREATININE 1.16 (H) 11/05/2022   BILITOT 0.6 11/05/2022   ALKPHOS 59 08/05/2016   AST 20 11/05/2022   ALT 26 11/05/2022   PROT 7.2 11/05/2022   ALBUMIN 4.4 08/05/2016   CALCIUM 9.7 11/05/2022   GFRAA 85 07/27/2020   June 16, 2023 CBC WBC 3.5, hemoglobin 12.5, platelets 213, UA showed many bacteria, C3-C4 normal, sed rate 10, SPEP normal  Nov 05, 2022 urine protein creatinine ratio normal, ANA 1: 80 NS, 1: 80 NH, dsDNA negative, C3-C4 normal, SSA> 8.0, ESR 11    Speciality Comments: Plaquenil eye exam normal on 07/02/2023 WNL @ [facility] Follow up 1 year  Procedures:  No procedures performed Allergies: Patient has no known allergies.   Assessment / Plan:     Visit Diagnoses: Sjogren's syndrome with keratoconjunctivitis sicca (HCC) - +ANA, +Ro, +La,RF- h/o sicca symptoms, fatigue, neutropenia: -Patient continues to have dry eye symptoms.  She has been using over-the-counter products.  She denies any history of shortness of breath or lymphadenopathy.  Labs obtained on June 16, 2023 sed rate was normal complements were normal.  White cell count was low at 3.5.  Plan: Sedimentation rate, Protein / creatinine ratio, urine, Anti-DNA antibody, double-stranded, Sjogrens syndrome-A extractable nuclear antibody  High risk medication use - Plaquenil 200 mg 1 tablet by mouth daily.  - Plan: CBC with Differential/Platelet, Comprehensive metabolic panel with GFR today.  Plaquenil eye  examination was normal on July 02, 2023.  History of neutropenia-white cell count is low and stable.  Primary osteoarthritis of both hands-she has bilateral PIP and DIP thickening with no synovitis.  Primary osteoarthritis of both feet-she denies any discomfort in her feet today.                                                  Other fatigue-she continues to have some fatigue.  Psoriasis-she denies any recent psoriasis lesions.  H/O rosacea-she is not using any topical agents currently.  She continues to have some erythema on her face.  History of hypertension-blood pressure was elevated at 167/101.  Repeat blood pressure was 166/81.  She was advised to monitor blood pressure closely and follow-up with her PCP.  Vitamin D deficiency-patient has history of vitamin D deficiency.  She was encouraged to take vitamin D on a regular basis.  Orders: Orders Placed This Encounter  Procedures   CBC with Differential/Platelet   Comprehensive metabolic panel with GFR   Sedimentation rate   Protein / creatinine ratio, urine   Anti-DNA  antibody, double-stranded   Sjogrens syndrome-A extractable nuclear antibody   No orders of the defined types were placed in this encounter.    Follow-Up Instructions: Return in about 5 months (around 02/11/2024) for Sjogren's.   Pollyann Savoy, MD  Note - This record has been created using Animal nutritionist.  Chart creation errors have been sought, but may not always  have been located. Such creation errors do not reflect on  the standard of medical care.

## 2023-09-11 ENCOUNTER — Encounter: Payer: Self-pay | Admitting: Rheumatology

## 2023-09-11 ENCOUNTER — Ambulatory Visit: Payer: 59 | Attending: Rheumatology | Admitting: Rheumatology

## 2023-09-11 DIAGNOSIS — M19072 Primary osteoarthritis, left ankle and foot: Secondary | ICD-10-CM

## 2023-09-11 DIAGNOSIS — M7072 Other bursitis of hip, left hip: Secondary | ICD-10-CM

## 2023-09-11 DIAGNOSIS — Z79899 Other long term (current) drug therapy: Secondary | ICD-10-CM | POA: Diagnosis not present

## 2023-09-11 DIAGNOSIS — R5383 Other fatigue: Secondary | ICD-10-CM

## 2023-09-11 DIAGNOSIS — L409 Psoriasis, unspecified: Secondary | ICD-10-CM

## 2023-09-11 DIAGNOSIS — E559 Vitamin D deficiency, unspecified: Secondary | ICD-10-CM

## 2023-09-11 DIAGNOSIS — Z872 Personal history of diseases of the skin and subcutaneous tissue: Secondary | ICD-10-CM

## 2023-09-11 DIAGNOSIS — Z862 Personal history of diseases of the blood and blood-forming organs and certain disorders involving the immune mechanism: Secondary | ICD-10-CM | POA: Diagnosis not present

## 2023-09-11 DIAGNOSIS — M19041 Primary osteoarthritis, right hand: Secondary | ICD-10-CM

## 2023-09-11 DIAGNOSIS — M19042 Primary osteoarthritis, left hand: Secondary | ICD-10-CM

## 2023-09-11 DIAGNOSIS — M3501 Sicca syndrome with keratoconjunctivitis: Secondary | ICD-10-CM | POA: Diagnosis not present

## 2023-09-11 DIAGNOSIS — Z8679 Personal history of other diseases of the circulatory system: Secondary | ICD-10-CM

## 2023-09-11 DIAGNOSIS — M19071 Primary osteoarthritis, right ankle and foot: Secondary | ICD-10-CM

## 2023-09-12 LAB — COMPREHENSIVE METABOLIC PANEL WITH GFR
AG Ratio: 1.6 (calc) (ref 1.0–2.5)
ALT: 21 U/L (ref 6–29)
AST: 19 U/L (ref 10–35)
Albumin: 4.3 g/dL (ref 3.6–5.1)
Alkaline phosphatase (APISO): 61 U/L (ref 37–153)
BUN: 17 mg/dL (ref 7–25)
CO2: 28 mmol/L (ref 20–32)
Calcium: 9.6 mg/dL (ref 8.6–10.4)
Chloride: 103 mmol/L (ref 98–110)
Creat: 0.91 mg/dL (ref 0.50–1.05)
Globulin: 2.7 g/dL (ref 1.9–3.7)
Glucose, Bld: 90 mg/dL (ref 65–99)
Potassium: 3.7 mmol/L (ref 3.5–5.3)
Sodium: 139 mmol/L (ref 135–146)
Total Bilirubin: 0.5 mg/dL (ref 0.2–1.2)
Total Protein: 7 g/dL (ref 6.1–8.1)
eGFR: 70 mL/min/{1.73_m2} (ref >=60)

## 2023-09-12 LAB — CBC WITH DIFFERENTIAL/PLATELET
Absolute Lymphocytes: 992 {cells}/uL (ref 850–3900)
Absolute Monocytes: 361 {cells}/uL (ref 200–950)
Basophils Absolute: 42 {cells}/uL (ref 0–200)
Basophils Relative: 1.1 %
Eosinophils Absolute: 99 {cells}/uL (ref 15–500)
Eosinophils Relative: 2.6 %
HCT: 33 % — ABNORMAL LOW (ref 35.0–45.0)
Hemoglobin: 11.2 g/dL — ABNORMAL LOW (ref 11.7–15.5)
MCH: 30.9 pg (ref 27.0–33.0)
MCHC: 33.9 g/dL (ref 32.0–36.0)
MCV: 90.9 fL (ref 80.0–100.0)
MPV: 11 fL (ref 7.5–12.5)
Monocytes Relative: 9.5 %
Neutro Abs: 2307 {cells}/uL (ref 1500–7800)
Neutrophils Relative %: 60.7 %
Platelets: 202 10*3/uL (ref 140–400)
RBC: 3.63 10*6/uL — ABNORMAL LOW (ref 3.80–5.10)
RDW: 12.7 % (ref 11.0–15.0)
Total Lymphocyte: 26.1 %
WBC: 3.8 10*3/uL (ref 3.8–10.8)

## 2023-09-12 LAB — ANTI-DNA ANTIBODY, DOUBLE-STRANDED: ds DNA Ab: <1 [IU]/mL

## 2023-09-12 LAB — PROTEIN / CREATININE RATIO, URINE
Creatinine, Urine: 71 mg/dL (ref 20–275)
Protein/Creat Ratio: 56 mg/g{creat} (ref 24–184)
Protein/Creatinine Ratio: 0.056 mg/mg{creat} (ref 0.024–0.184)
Total Protein, Urine: 4 mg/dL — ABNORMAL LOW (ref 5–24)

## 2023-09-12 LAB — SJOGRENS SYNDROME-A EXTRACTABLE NUCLEAR ANTIBODY: SSA (Ro) (ENA) Antibody, IgG: >8 AI — AB

## 2023-09-12 LAB — SEDIMENTATION RATE: Sed Rate: 17 mm/h (ref 0–30)

## 2023-09-14 NOTE — Progress Notes (Signed)
 Hemoglobin is low at 11.2.  CMP normal, SSA remains positive, urine protein creatinine ratio normal, double-stranded DNA negative, sed rate normal.  Labs do not indicate an autoimmune disease flare.

## 2023-10-03 ENCOUNTER — Other Ambulatory Visit: Payer: Self-pay

## 2023-10-03 DIAGNOSIS — M3501 Sicca syndrome with keratoconjunctivitis: Secondary | ICD-10-CM

## 2023-10-03 MED ORDER — HYDROXYCHLOROQUINE SULFATE 200 MG PO TABS: 200.0000 mg | ORAL_TABLET | Freq: Every day | ORAL | 0 refills | Status: DC

## 2023-10-03 NOTE — Telephone Encounter (Signed)
 Last Fill: 07/04/2023  Eye exam: 07/02/2023    Labs: 09/11/2023 Hemoglobin is low at 11.2.  CMP normal, SSA remains positive, urine protein creatinine ratio normal, double-stranded DNA negative, sed rate normal.  Labs do not indicate an autoimmune disease flare.   Next Visit: 02/12/2024  Last Visit: 09/11/2023  FA:[id number] syndrome with keratoconjunctivitis sicca   Current Dose per office note on 09/11/2023: Plaquenil  200 mg 1 tablet by mouth daily.   Okay to refill Plaquenil ?

## 2024-01-02 ENCOUNTER — Other Ambulatory Visit: Payer: Self-pay | Admitting: Physician Assistant

## 2024-01-02 DIAGNOSIS — M3501 Sicca syndrome with keratoconjunctivitis: Secondary | ICD-10-CM

## 2024-01-02 NOTE — Telephone Encounter (Signed)
 Last Fill: 10/03/2023  Eye exam:  07/02/2023 WNL    Labs: 09/11/2023 Hemoglobin is low at 11.2.  CMP normal, SSA remains positive, urine protein creatinine ratio normal, double-stranded DNA negative, sed rate normal   Next Visit: 02/12/2024  Last Visit: 09/11/2023  IK:Dgnhmzw'd syndrome with keratoconjunctivitis sicca   Current Dose per office note 09/11/2023: Plaquenil  200 mg 1 tablet by mouth daily   Okay to refill Plaquenil ?

## 2024-01-29 NOTE — Progress Notes (Signed)
 Office Visit Note  Patient: Suzanne [name]             Date of Birth: 10-05-1960           MRN: [id number]             PCP: Ina Marcellus RAMAN, MD Referring: Ina Marcellus RAMAN, MD Visit Date: 02/12/2024 Occupation: @GUAROCC @  Subjective:  Medication monitoring   History of Present Illness: Suzanne [name] is a 63 y.o. female with history of Sjogren syndrome and osteoarthritis.  Patient is taking Plaquenil  200 mg 1 tablet by mouth daily.  She is tolerating Plaquenil  without any side effects and has not had any recent gaps in therapy.  Patient continues to have chronic dry eyes and has been using eyedrops as needed for symptomatic relief.  She has not noticed any increased mouth dryness.  She has been continuing to see the dentist every 6 months and ophthalmology on a yearly basis.  Patient states that she has an upcoming visit with her PCP on 03/16/2024 at which time she plans on having updated lab work obtained. She denies any new or worsening pulmonary symptoms.  She denies any swollen lymph nodes or salivary stones.  Patient has occasional discomfort in her shoulders, hips, both knee joints.  She denies any joint swelling.     Activities of Daily Living:  Patient reports morning stiffness for 20 minutes.   Patient Reports nocturnal pain.  Difficulty dressing/grooming: Denies Difficulty climbing stairs: Denies Difficulty getting out of chair: Denies Difficulty using hands for taps, buttons, cutlery, and/or writing: Denies  Review of Systems  Constitutional:  Positive for fatigue.  HENT:  Negative for mouth sores and mouth dryness.   Eyes:  Positive for dryness.  Respiratory:  Negative for shortness of breath.   Cardiovascular:  Negative for chest pain and palpitations.  Gastrointestinal:  Positive for constipation and diarrhea. Negative for blood in stool.  Endocrine: Negative for increased urination.  Genitourinary:  Negative for involuntary urination.  Musculoskeletal:  Positive  for joint pain, joint pain, myalgias, muscle weakness, morning stiffness, muscle tenderness and myalgias. Negative for gait problem and joint swelling.  Skin:  Positive for sensitivity to sunlight. Negative for color change, rash and hair loss.  Allergic/Immunologic: Negative for susceptible to infections.  Neurological:  Negative for dizziness and headaches.  Hematological:  Negative for swollen glands.  Psychiatric/Behavioral:  Positive for depressed mood and sleep disturbance. The patient is nervous/anxious.     PMFS History:  Patient Active Problem List   Diagnosis Date Noted   Autoimmune disease (HCC) 08/26/2016   High risk medication use 08/26/2016   Psoriasis 08/26/2016   Primary osteoarthritis of both hands 08/26/2016   History of Sjogren's disease (HCC) 08/26/2016   Pain, joint, knee, left 08/26/2016   History of hypertension 08/26/2016   H/O rosacea 08/26/2016    Past Medical History:  Diagnosis Date   Autoimmune disease (HCC)    Hypertension     Family History  Problem Relation Age of Onset   Hypertension Mother    Stroke Mother    Hypercholesterolemia Mother    Cancer Father    Clotting disorder Daughter    Hashimoto's thyroiditis Daughter    Rheum arthritis Daughter    Past Surgical History:  Procedure Laterality Date   TOOTH EXTRACTION  06/13/2021   Social History   Social History Narrative   Not on file   Immunization History  Administered Date(s) Administered   Influenza,inj,Quad PF,6+ Mos 03/23/2017  Janssen (J&J) SARS-COV-2 Vaccination 09/22/2019   PFIZER(Purple Top)SARS-COV-2 Vaccination 04/23/2020     Objective: Vital Signs: BP 131/72 (BP Location: Left Arm, Patient Position: Sitting, Cuff Size: Normal)   Pulse (!) 59   Resp 14   Ht 5' 7 (1.702 m)   Wt 170 lb 3.2 oz (77.2 kg)   BMI 26.66 kg/m    Physical Exam Vitals and nursing note reviewed.  Constitutional:      Appearance: She is well-developed.  HENT:     Head: Normocephalic  and atraumatic.  Eyes:     Conjunctiva/sclera: Conjunctivae normal.  Cardiovascular:     Rate and Rhythm: Normal rate and regular rhythm.     Heart sounds: Normal heart sounds.  Pulmonary:     Effort: Pulmonary effort is normal.     Breath sounds: Normal breath sounds.  Abdominal:     General: Bowel sounds are normal.     Palpations: Abdomen is soft.  Musculoskeletal:     Cervical back: Normal range of motion.  Lymphadenopathy:     Cervical: No cervical adenopathy.  Skin:    General: Skin is warm and dry.     Capillary Refill: Capillary refill takes less than 2 seconds.  Neurological:     Mental Status: She is [name] and oriented to person, place, and time.  Psychiatric:        Behavior: Behavior normal.      Musculoskeletal Exam: C-spine, thoracic spine, lumbar spine and good range of motion.  Shoulder joints and elbow joints, wrist joints, MCPs, PIPs, DIPs are good range of motion with no synovitis.  PIP and DIP thickening consistent with osteoarthritis of both hands.  Hip joints have good range of motion with no groin pain.  Knee joints have good range of motion with no warmth or effusion.  Ankle joints have good range of motion with no tenderness or joint swelling.  CDAI Exam: CDAI Score: -- Patient Global: --; Provider Global: -- Swollen: --; Tender: -- Joint Exam 02/12/2024   No joint exam has been documented for this visit   There is currently no information documented on the homunculus. Go to the Rheumatology activity and complete the homunculus joint exam.  Investigation: No additional findings.  Imaging: No results found.  Recent Labs: Lab Results  Component Value Date   WBC 3.8 09/11/2023   HGB 11.2 (L) 09/11/2023   PLT 202 09/11/2023   NA 139 09/11/2023   K 3.7 09/11/2023   CL 103 09/11/2023   CO2 28 09/11/2023   GLUCOSE 90 09/11/2023   BUN 17 09/11/2023   CREATININE 0.91 09/11/2023   BILITOT 0.5 09/11/2023   ALKPHOS 59 08/05/2016   AST 19  09/11/2023   ALT 21 09/11/2023   PROT 7.0 09/11/2023   ALBUMIN 4.4 08/05/2016   CALCIUM 9.6 09/11/2023   GFRAA 85 07/27/2020    Speciality Comments: Plaquenil  eye exam normal on 07/02/2023 WNL @ [facility] Follow up 1 year  Procedures:  No procedures performed Allergies: Patient has no known allergies.    Assessment / Plan:     Visit Diagnoses: Sjogren's syndrome with keratoconjunctivitis sicca (HCC) - +ANA, +Ro, +La,RF- h/o sicca symptoms, fatigue, neutropenia: She continues to have symptoms of chronic dry eyes.  She has been using eyedrops as needed for symptomatic relief.  She has not had any increased mouth dryness.  She denies any salivary stones or cervical lymphadenopathy.  She continues to see the dentist every 6 months and ophthalmology on a yearly  basis.   No signs of inflammatory arthritis were noted.  No synovitis noted on exam.  She is taking Plaquenil  200 mg 1 tablet by mouth daily.  No recent gaps in therapy. She has not had any new or worsening pulmonary symptoms.  Her lungs were clear to auscultation today.  Discussed the increased risk for developing interstitial lung disease in patients with Sjogren syndrome.  Also discussed the increased risk for developing lymphoma in patients with Sjogren syndrome. Lab work from 09/11/2023 was reviewed today in the office: Ro antibody remains positive, double-stranded  DNA negative, ESR within normal limits, and no proteinuria.  Plan to obtain the following lab work with her upcoming labs on 03/16/2024--Future orders were placed and orders were written out and provided to the patient to take with her.  She will notify us  if she develops any new or worsening symptoms.  She will remain on Plaquenil  as prescribed.  She will follow up in 5 months or sooner if needed.   - Plan: C3 and C4, Sedimentation rate, Comprehensive metabolic panel with GFR, Urinalysis, Routine w reflex microscopic, ANA, CBC with Differential/Platelet, Serum  protein electrophoresis with reflex  High risk medication use - Plaquenil  200 mg 1 tablet by mouth daily.  CBC and CMP updated on 09/11/23.  She will be having updated lab work performed on 03/16/2024 with her PCP-future order were placed today.  Plaquenil  eye exam normal on 07/02/2023 WNL @ [facility] Follow up 1 year  - Plan: Comprehensive metabolic panel with GFR, CBC with Differential/Platelet  History of neutropenia - CBC with diff updated today.  Plan: CBC with Differential/Platelet  Primary osteoarthritis of both hands: She has PIP and DIP thickening consistent with osteoarthritis of both hands.  No synovitis noted.   Primary osteoarthritis of both feet: She has good range of motion of both ankle joints with no tenderness or joint swelling.  She is wearing proper fitting shoes.  Other fatigue: Chronic, stable.  Other medical conditions are listed as follows:  Psoriasis  H/O rosacea  History of hypertension: Blood pressure was 131/72 today in the office.  Vitamin D  deficiency  Ischial bursitis of left side: Not currently symptomatic.   Orders: Orders Placed This Encounter  Procedures   C3 and C4   Sedimentation rate   Comprehensive metabolic panel with GFR   Urinalysis, Routine w reflex microscopic   ANA   CBC with Differential/Platelet   Serum protein electrophoresis with reflex   No orders of the defined types were placed in this encounter.   Follow-Up Instructions: Return in about 5 months (around 07/14/2024) for Sjogren's syndrome.   Waddell CHRISTELLA Craze, PA-C  Note - This record has been created using Dragon software.  Chart creation errors have been sought, but may not always  have been located. Such creation errors do not reflect on  the standard of medical care.

## 2024-02-12 ENCOUNTER — Ambulatory Visit: Attending: Physician Assistant | Admitting: Physician Assistant

## 2024-02-12 ENCOUNTER — Encounter: Payer: Self-pay | Admitting: Physician Assistant

## 2024-02-12 VITALS — BP 131/72 | HR 59 | Resp 14 | Ht 67.0 in | Wt 170.2 lb

## 2024-02-12 DIAGNOSIS — Z862 Personal history of diseases of the blood and blood-forming organs and certain disorders involving the immune mechanism: Secondary | ICD-10-CM

## 2024-02-12 DIAGNOSIS — R5383 Other fatigue: Secondary | ICD-10-CM

## 2024-02-12 DIAGNOSIS — L409 Psoriasis, unspecified: Secondary | ICD-10-CM

## 2024-02-12 DIAGNOSIS — Z79899 Other long term (current) drug therapy: Secondary | ICD-10-CM | POA: Diagnosis not present

## 2024-02-12 DIAGNOSIS — M7072 Other bursitis of hip, left hip: Secondary | ICD-10-CM

## 2024-02-12 DIAGNOSIS — E559 Vitamin D deficiency, unspecified: Secondary | ICD-10-CM

## 2024-02-12 DIAGNOSIS — M19072 Primary osteoarthritis, left ankle and foot: Secondary | ICD-10-CM

## 2024-02-12 DIAGNOSIS — M3501 Sicca syndrome with keratoconjunctivitis: Secondary | ICD-10-CM | POA: Diagnosis not present

## 2024-02-12 DIAGNOSIS — M19042 Primary osteoarthritis, left hand: Secondary | ICD-10-CM

## 2024-02-12 DIAGNOSIS — M19041 Primary osteoarthritis, right hand: Secondary | ICD-10-CM | POA: Diagnosis not present

## 2024-02-12 DIAGNOSIS — Z8679 Personal history of other diseases of the circulatory system: Secondary | ICD-10-CM

## 2024-02-12 DIAGNOSIS — Z872 Personal history of diseases of the skin and subcutaneous tissue: Secondary | ICD-10-CM

## 2024-02-12 DIAGNOSIS — M19071 Primary osteoarthritis, right ankle and foot: Secondary | ICD-10-CM

## 2024-03-16 LAB — LAB REPORT - SCANNED

## 2024-03-24 ENCOUNTER — Telehealth: Payer: Self-pay | Admitting: *Deleted

## 2024-03-24 NOTE — Telephone Encounter (Signed)
 Labs received from: [facility], [place]  Drawn on: 03/16/2024  Reviewed by: Waddell Craze, PA-C  Labs drawn: ANA, C3 and C4, CBC, CMP, IFE and PE, Serum, Lipid Panel, Sed Rat and UA  Results: ANA: Positive   WBC 3.6   RBC 4.17   Hct 36   Lymph # 0.7  Trig. 161  CHOL 256  LDLC 163  Non-HDL 195  Patient is on PLQ 200 mg po daily.

## 2024-04-02 ENCOUNTER — Other Ambulatory Visit: Payer: Self-pay | Admitting: Rheumatology

## 2024-04-02 DIAGNOSIS — M3501 Sicca syndrome with keratoconjunctivitis: Secondary | ICD-10-CM

## 2024-04-02 NOTE — Telephone Encounter (Signed)
 Last Fill: 01/02/2024  Eye exam: 07/02/2023 WNL    Labs: 03/16/2024 ANA: Positive   WBC 3.6   RBC 4.17   Hct 36   Lymph # 0.7  Trig. 161  CHOL 256  LDLC 163  Non-HDL 195  Next Visit: 07/15/2023  Last Visit: 02/12/2024  IK:Dgnhmzw'd syndrome with keratoconjunctivitis sicca   Current Dose per office note 02/12/2024: Plaquenil  200 mg 1 tablet by mouth daily   Okay to refill Plaquenil ?

## 2024-06-30 NOTE — Progress Notes (Signed)
 "  Office Visit Note  Patient: Suzanne [name]             Date of Birth: [birth year]           MRN: [id number]             PCP: Suzanne Marcellus RAMAN, MD Referring: Suzanne Marcellus RAMAN, MD Visit Date: 07/14/2024 Occupation: Data Unavailable  Subjective:  Medication management  History of Present Illness: Suzanne [name] is a 64 y.o. female with Sjogren's and osteoarthritis.  She returns today after her last visit in August 2025.  She continues to have some dry eye symptoms.  She denies dry mouth symptoms.  She has been taking Plaquenil  200 mg daily without any interruption.  She gives history of some stiffness in her hands and her feet.  Her fatigue has not changed.  Examination was canceled due to the weather.  She is scheduled for repeat eye examination on July 27, 2024.  She denies any shortness of breath.  Is been going for regular dental visits.  She has been experiencing some discomfort in her left shoulder and her left gluteal region.  She states she notices the gluteal pain when she is putting on her shoes.    Activities of Daily Living:  Patient reports morning stiffness for 20 minutes.   Patient Reports nocturnal pain.  Difficulty dressing/grooming: Denies Difficulty climbing stairs: Denies Difficulty getting out of chair: Denies Difficulty using hands for taps, buttons, cutlery, and/or writing: Denies  Review of Systems  Constitutional:  Positive for fatigue.  HENT:  Negative for mouth sores and mouth dryness.   Eyes:  Positive for dryness.  Respiratory:  Negative for shortness of breath.   Cardiovascular:  Negative for chest pain and palpitations.  Gastrointestinal:  Positive for constipation and diarrhea. Negative for blood in stool.  Endocrine: Negative for increased urination.  Genitourinary:  Negative for involuntary urination.  Musculoskeletal:  Positive for joint pain, joint pain, myalgias, morning stiffness and myalgias. Negative for gait problem, joint swelling,  muscle weakness and muscle tenderness.  Skin:  Positive for sensitivity to sunlight. Negative for color change, rash and hair loss.  Allergic/Immunologic: Negative for susceptible to infections.  Neurological:  Negative for dizziness and headaches.  Hematological:  Negative for swollen glands.  Psychiatric/Behavioral:  Negative for depressed mood and sleep disturbance. The patient is not nervous/anxious.     PMFS History:  Patient Active Problem List   Diagnosis Date Noted   Autoimmune disease 08/26/2016   High risk medication use 08/26/2016   Psoriasis 08/26/2016   Primary osteoarthritis of both hands 08/26/2016   History of Sjogren's disease 08/26/2016   Pain, joint, knee, left 08/26/2016   History of hypertension 08/26/2016   H/O rosacea 08/26/2016    Past Medical History:  Diagnosis Date   Autoimmune disease    Hypertension    Sjogren's disease     Family History  Problem Relation Age of Onset   Hypertension Mother    Stroke Mother    Hypercholesterolemia Mother    Cancer Father    Clotting disorder Daughter    Hashimoto's thyroiditis Daughter    Rheum arthritis Daughter    Colon cancer Neg Hx    Rectal cancer Neg Hx    Stomach cancer Neg Hx    Esophageal cancer Neg Hx    Past Surgical History:  Procedure Laterality Date   DILATION AND CURETTAGE OF UTERUS     at age 13   TOOTH EXTRACTION  06/13/2021  Social History[1] Social History   Social History Narrative   Not on file     Immunization History  Administered Date(s) Administered   Influenza,inj,Quad PF,6+ Mos 03/23/2017   Janssen (J&J) SARS-COV-2 Vaccination 09/22/2019   PFIZER(Purple Top)SARS-COV-2 Vaccination 04/23/2020     Objective: Vital Signs: BP 127/66 (BP Location: Left Arm, Patient Position: Sitting, Cuff Size: Normal)   Pulse 64   Temp 98 F (36.7 C)   Resp 16   Ht 5' 7 (1.702 m)   Wt 175 lb (79.4 kg)   BMI 27.41 kg/m    Physical Exam Vitals and nursing note reviewed.   Constitutional:      Appearance: She is well-developed.  HENT:     Head: Normocephalic and atraumatic.  Eyes:     Conjunctiva/sclera: Conjunctivae normal.  Cardiovascular:     Rate and Rhythm: Normal rate and regular rhythm.     Heart sounds: Normal heart sounds.  Pulmonary:     Effort: Pulmonary effort is normal.     Breath sounds: Normal breath sounds.  Abdominal:     General: Bowel sounds are normal.     Palpations: Abdomen is soft.  Musculoskeletal:     Cervical back: Normal range of motion.  Lymphadenopathy:     Cervical: No cervical adenopathy.  Skin:    General: Skin is warm and dry.     Capillary Refill: Capillary refill takes less than 2 seconds.  Neurological:     Mental Status: She is [name] and oriented to person, place, and time.  Psychiatric:        Behavior: Behavior normal.      Musculoskeletal Exam: Cervical, thoracic and lumbar spine were in good range of motion.  There was no SI joint tenderness.  Shoulder joints, elbow joints, wrist joints, MCPs, PIPs and DIPs were in good range of motion with no synovitis.  She has some discomfort on abduction of her left shoulder joint and tenderness over subacromial region.  Bilateral DIP thickening with no synovitis was noted.  Hip joints and knee joints were in good range of motion without any warmth swelling or effusion.  She had tenderness over left piriformis region.  There was no tenderness over ankles or MTPs.   CDAI Exam: CDAI Score: -- Patient Global: --; Provider Global: -- Swollen: --; Tender: -- Joint Exam 07/14/2024   No joint exam has been documented for this visit   There is currently no information documented on the homunculus. Go to the Rheumatology activity and complete the homunculus joint exam.  Investigation: No additional findings.  Imaging: No results found.  Recent Labs: Lab Results  Component Value Date   WBC 3.8 09/11/2023   HGB 11.2 (L) 09/11/2023   PLT 202 09/11/2023   NA 139  09/11/2023   K 3.7 09/11/2023   CL 103 09/11/2023   CO2 28 09/11/2023   GLUCOSE 90 09/11/2023   BUN 17 09/11/2023   CREATININE 0.91 09/11/2023   BILITOT 0.5 09/11/2023   ALKPHOS 59 08/05/2016   AST 19 09/11/2023   ALT 21 09/11/2023   PROT 7.0 09/11/2023   ALBUMIN 4.4 08/05/2016   CALCIUM 9.6 09/11/2023   GFRAA 85 07/27/2020    Speciality Comments: Plaquenil  eye exam normal on 07/02/2023 WNL @ [facility] Follow up 1 year Appt 07/27/2024 per patient she states her  Procedures:  No procedures performed Allergies: Patient has no known allergies.   Assessment / Plan:     Visit Diagnoses: Sjogren's syndrome with keratoconjunctivitis sicca - +  ANA, +Ro, +La,RF- h/o sicca symptoms, fatigue, neutropenia: -She continues to have dry eye symptoms.  She denies any symptoms of dry mouth.  Over-the-counter products were discussed.  She had an appointment with a dentist yesterday and had no cavities.  She has been taking Plaquenil  on regular basis and using over-the-counter products.  Will check labs today.  Plan: Protein / creatinine ratio, urine, Anti-DNA antibody, double-stranded, Sedimentation rate, C3 and C4, Sjogrens syndrome-A extractable nuclear antibody, Serum protein electrophoresis with reflex  High risk medication use - Plaquenil  200 mg 1 tablet by mouth daily. Plaquenil  eye exam normal on 07/02/2023 - Plan: CBC with Differential/Platelet, Comprehensive metabolic panel with GFR  History of neutropenia-she has not had neutropenia since 2022.  Chronic left shoulder pain-she has been experiencing some pain in the left shoulder which she describes over her left subacromial region.  I gave her a handout on exercises.  I also offered physical therapy if her symptoms get worse.  Patient will notify us .  Primary osteoarthritis of both hands-bilateral DIP thickening was noted.  Joint protection muscle strengthening was discussed.  Piriformis syndrome of left side-she has been  experiencing some discomfort in the left gluteal region.  The tenderness was over the piriformis region.  A handout on piriformis syndrome rehab was given.  If her symptoms do not improve we may refer her to physical therapy.  Primary osteoarthritis of both feet-currently not symptomatic.  Proper fitting shoes were advised.  Other fatigue-she continues to have some fatigue.  Psoriasis-no active lesions currently.  H/O rosacea  Vitamin D  deficiency-vitamin D  was 40 in 2023.  She has been taking vitamin D  on a regular basis.  History of hypertension-blood pressure was normal today.  Orders: Orders Placed This Encounter  Procedures   CBC with Differential/Platelet   Comprehensive metabolic panel with GFR   Protein / creatinine ratio, urine   Anti-DNA antibody, double-stranded   Sedimentation rate   C3 and C4   Sjogrens syndrome-A extractable nuclear antibody   Serum protein electrophoresis with reflex   No orders of the defined types were placed in this encounter.    Follow-Up Instructions: Return in about 5 months (around 12/12/2024) for Sjogren's.   Maya Nash, MD  Note - This record has been created using Animal nutritionist.  Chart creation errors have been sought, but may not always  have been located. Such creation errors do not reflect on  the standard of medical care.     [1]  Social History Tobacco Use   Smoking status: Former    Current packs/day: 0.00    Average packs/day: 1 pack/day for 15.0 years (15.0 ttl pk-yrs)    Types: Cigarettes    Start date: 88    Quit date: 44    Years since quitting: 41.1    Passive exposure: Never   Smokeless tobacco: Never  Vaping Use   Vaping status: Never Used  Substance Use Topics   Alcohol use: Yes    Comment: occa   Drug use: Never   "

## 2024-07-02 ENCOUNTER — Ambulatory Visit

## 2024-07-02 VITALS — Ht 67.0 in | Wt 175.0 lb

## 2024-07-02 DIAGNOSIS — Z1211 Encounter for screening for malignant neoplasm of colon: Secondary | ICD-10-CM

## 2024-07-02 MED ORDER — NA SULFATE-K SULFATE-MG SULF 17.5-3.13-1.6 GM/177ML PO SOLN: 1.0000 | Freq: Once | ORAL | 0 refills | Status: AC

## 2024-07-02 NOTE — Progress Notes (Signed)

## 2024-07-06 ENCOUNTER — Encounter: Payer: Self-pay | Admitting: Gastroenterology

## 2024-07-14 ENCOUNTER — Ambulatory Visit: Attending: Rheumatology | Admitting: Rheumatology

## 2024-07-14 ENCOUNTER — Encounter: Payer: Self-pay | Admitting: Rheumatology

## 2024-07-14 VITALS — BP 127/66 | HR 64 | Temp 98.0°F | Resp 16 | Ht 67.0 in | Wt 175.0 lb

## 2024-07-14 DIAGNOSIS — M19071 Primary osteoarthritis, right ankle and foot: Secondary | ICD-10-CM

## 2024-07-14 DIAGNOSIS — M25512 Pain in left shoulder: Secondary | ICD-10-CM | POA: Diagnosis not present

## 2024-07-14 DIAGNOSIS — Z79899 Other long term (current) drug therapy: Secondary | ICD-10-CM

## 2024-07-14 DIAGNOSIS — M7072 Other bursitis of hip, left hip: Secondary | ICD-10-CM

## 2024-07-14 DIAGNOSIS — L409 Psoriasis, unspecified: Secondary | ICD-10-CM | POA: Diagnosis not present

## 2024-07-14 DIAGNOSIS — E559 Vitamin D deficiency, unspecified: Secondary | ICD-10-CM

## 2024-07-14 DIAGNOSIS — Z862 Personal history of diseases of the blood and blood-forming organs and certain disorders involving the immune mechanism: Secondary | ICD-10-CM | POA: Diagnosis not present

## 2024-07-14 DIAGNOSIS — R5383 Other fatigue: Secondary | ICD-10-CM

## 2024-07-14 DIAGNOSIS — M3501 Sicca syndrome with keratoconjunctivitis: Secondary | ICD-10-CM | POA: Diagnosis not present

## 2024-07-14 DIAGNOSIS — G5702 Lesion of sciatic nerve, left lower limb: Secondary | ICD-10-CM

## 2024-07-14 DIAGNOSIS — M19041 Primary osteoarthritis, right hand: Secondary | ICD-10-CM | POA: Diagnosis not present

## 2024-07-14 DIAGNOSIS — M19042 Primary osteoarthritis, left hand: Secondary | ICD-10-CM

## 2024-07-14 DIAGNOSIS — Z8679 Personal history of other diseases of the circulatory system: Secondary | ICD-10-CM

## 2024-07-14 DIAGNOSIS — G8929 Other chronic pain: Secondary | ICD-10-CM

## 2024-07-14 DIAGNOSIS — Z872 Personal history of diseases of the skin and subcutaneous tissue: Secondary | ICD-10-CM

## 2024-07-14 DIAGNOSIS — M19072 Primary osteoarthritis, left ankle and foot: Secondary | ICD-10-CM

## 2024-07-14 NOTE — Patient Instructions (Signed)
 Vaccines You are taking a medication(s) that can suppress your immune system.  The following immunizations are recommended: Flu annually Covid-19  Td/Tdap (tetanus, diphtheria, pertussis) every 10 years Pneumonia (Prevnar 15 then Pneumovax 23 at least 1 year apart.  Alternatively, can take Prevnar 20 without needing additional dose) Shingrix: 2 doses from 4 weeks to 6 months apart  Please check with your PCP to make sure you are up to date.   Shoulder Exercises Ask your health care provider which exercises are safe for you. Do exercises exactly as told by your health care provider and adjust them as directed. It is normal to feel mild stretching, pulling, tightness, or discomfort as you do these exercises. Stop right away if you feel sudden pain or your pain gets worse. Do not begin these exercises until told by your health care provider. Stretching exercises External rotation and abduction This exercise is sometimes called corner stretch. The exercise rotates your arm outward (external rotation) and moves your arm out from your body (abduction). Stand in a doorway with one of your feet slightly in front of the other. This is called a staggered stance. If you cannot reach your forearms to the door frame, stand facing a corner of a room. Choose one of the following positions as told by your health care provider: Place your hands and forearms on the door frame above your head. Place your hands and forearms on the door frame at the height of your head. Place your hands on the door frame at the height of your elbows. Slowly move your weight onto your front foot until you feel a stretch across your chest and in the front of your shoulders. Keep your head and chest upright and keep your abdominal muscles tight. Hold for __________ seconds. To release the stretch, shift your weight to your back foot. Repeat __________ times. Complete this exercise __________ times a day. Extension, standing  Stand  and hold a broomstick, a cane, or a similar object behind your back. Your hands should be a little wider than shoulder-width apart. Your palms should face away from your back. Keeping your elbows straight and your shoulder muscles relaxed, move the stick away from your body until you feel a stretch in your shoulders (extension). Avoid shrugging your shoulders while you move the stick. Keep your shoulder blades tucked down toward the middle of your back. Hold for __________ seconds. Slowly return to the starting position. Repeat __________ times. Complete this exercise __________ times a day. Range-of-motion exercises Pendulum  Stand near a wall or a surface that you can hold onto for balance. Bend at the waist and let your left / right arm hang straight down. Use your other arm to support you. Keep your back straight and do not lock your knees. Relax your left / right arm and shoulder muscles, and move your hips and your trunk so your left / right arm swings freely. Your arm should swing because of the motion of your body, not because you are using your arm or shoulder muscles. Keep moving your hips and trunk so your arm swings in the following directions, as told by your health care provider: Side to side. Forward and backward. In clockwise and counterclockwise circles. Continue each motion for __________ seconds, or for as long as told by your health care provider. Slowly return to the starting position. Repeat __________ times. Complete this exercise __________ times a day. Shoulder flexion, standing  Stand and hold a broomstick, a cane, or a  similar object. Place your hands a little more than shoulder-width apart on the object. Your left / right hand should be palm-up, and your other hand should be palm-down. Keep your elbow straight and your shoulder muscles relaxed. Push the stick up with your healthy arm to raise your left / right arm in front of your body, and then over your head  until you feel a stretch in your shoulder (flexion). Avoid shrugging your shoulder while you raise your arm. Keep your shoulder blade tucked down toward the middle of your back. Hold for __________ seconds. Slowly return to the starting position. Repeat __________ times. Complete this exercise __________ times a day. Shoulder abduction, standing  Stand and hold a broomstick, a cane, or a similar object. Place your hands a little more than shoulder-width apart on the object. Your left / right hand should be palm-up, and your other hand should be palm-down. Keep your elbow straight and your shoulder muscles relaxed. Push the object across your body toward your left / right side. Raise your left / right arm to the side of your body (abduction) until you feel a stretch in your shoulder. Do not raise your arm above shoulder height unless your health care provider tells you to do that. If directed, raise your arm over your head. Avoid shrugging your shoulder while you raise your arm. Keep your shoulder blade tucked down toward the middle of your back. Hold for __________ seconds. Slowly return to the starting position. Repeat __________ times. Complete this exercise __________ times a day. Internal rotation  Place your left / right hand behind your back, palm-up. Use your other hand to dangle an exercise band, a broomstick, or a similar object over your shoulder. Grasp the band with your left / right hand so you are holding on to both ends. Gently pull up on the band until you feel a stretch in the front of your left / right shoulder. The movement of your arm toward the center of your body is called internal rotation. Avoid shrugging your shoulder while you raise your arm. Keep your shoulder blade tucked down toward the middle of your back. Hold for __________ seconds. Release the stretch by letting go of the band and lowering your hands. Repeat __________ times. Complete this exercise __________  times a day. Strengthening exercises External rotation  Sit in a stable chair without armrests. Secure an exercise band to a stable object at elbow height on your left / right side. Place a soft object, such as a folded towel or a small pillow, between your left / right upper arm and your body to move your elbow about 4 inches (10 cm) away from your side. Hold the end of the exercise band so it is tight and there is no slack. Keeping your elbow pressed against the soft object, slowly move your forearm out, away from your abdomen (external rotation). Keep your body steady so only your forearm moves. Hold for __________ seconds. Slowly return to the starting position. Repeat __________ times. Complete this exercise __________ times a day. Shoulder abduction  Sit in a stable chair without armrests, or stand up. Hold a __________ lb / kg weight in your left / right hand, or hold an exercise band with both hands. Start with your arms straight down and your left / right palm facing in, toward your body. Slowly lift your left / right hand out to your side (abduction). Do not lift your hand above shoulder height unless your health  care provider tells you that this is safe. Keep your arms straight. Avoid shrugging your shoulder while you do this movement. Keep your shoulder blade tucked down toward the middle of your back. Hold for __________ seconds. Slowly lower your arm, and return to the starting position. Repeat __________ times. Complete this exercise __________ times a day. Shoulder extension  Sit in a stable chair without armrests, or stand up. Secure an exercise band to a stable object in front of you so it is at shoulder height. Hold one end of the exercise band in each hand. Straighten your elbows and lift your hands up to shoulder height. Squeeze your shoulder blades together as you pull your hands down to the sides of your thighs (extension). Stop when your hands are straight down by  your sides. Do not let your hands go behind your body. Hold for __________ seconds. Slowly return to the starting position. Repeat __________ times. Complete this exercise __________ times a day. Shoulder row  Sit in a stable chair without armrests, or stand up. Secure an exercise band to a stable object in front of you so it is at chest height. Hold one end of the exercise band in each hand. Position your palms so that your thumbs are facing the ceiling (neutral position). Bend each of your elbows to a 90-degree angle (right angle) and keep your upper arms at your sides. Step back or move the chair back until the band is tight and there is no slack. Slowly pull your elbows back behind you. Hold for __________ seconds. Slowly return to the starting position. Repeat __________ times. Complete this exercise __________ times a day. Shoulder press-ups  Sit in a stable chair that has armrests. Sit upright, with your feet flat on the floor. Put your hands on the armrests so your elbows are bent and your fingers are pointing forward. Your hands should be about even with the sides of your body. Push down on the armrests and use your arms to lift yourself off the chair. Straighten your elbows and lift yourself up as much as you comfortably can. Move your shoulder blades down, and avoid letting your shoulders move up toward your ears. Keep your feet on the ground. As you get stronger, your feet should support less of your body weight as you lift yourself up. Hold for __________ seconds. Slowly lower yourself back into the chair. Repeat __________ times. Complete this exercise __________ times a day. Wall push-ups  Stand so you are facing a stable wall. Your feet should be about one arm-length away from the wall. Lean forward and place your palms on the wall at shoulder height. Keep your feet flat on the floor as you bend your elbows and lean forward toward the wall. Hold for __________  seconds. Straighten your elbows to push yourself back to the starting position. Repeat __________ times. Complete this exercise __________ times a day. This information is not intended to replace advice given to you by your health care provider. Make sure you discuss any questions you have with your health care provider. Document Revised: 07/24/2021 Document Reviewed: 07/24/2021 Elsevier Patient Education  2024 [employer]Piriformis Syndrome Rehab Ask your health care provider which exercises are safe for you. Do exercises exactly as told by your health care provider and adjust them as directed. It is normal to feel mild stretching, pulling, tightness, or discomfort as you do these exercises. Stop right away if you feel sudden pain or your pain gets worse. Do not  begin these exercises until told by your health care provider. Stretching and range-of-motion exercises These exercises warm up your muscles and joints and improve the movement and flexibility of your hip and pelvis. The exercises also help to relieve pain, numbness, and tingling. Nerve root  Sit on a firm surface that is high enough that you can swing your left / right foot freely. Place a folded towel under your left / right thigh. This is optional. Drop your head forward and round your back. While you keep your left / right foot relaxed, slowly straighten your left / right knee until you feel a slight pull behind your knee or calf. If your leg is fully extended and you still do not feel a pull, slowly tilt your foot and toes toward you. Hold this position for __________ seconds. Slowly return your knee to its starting position. Hip rotation This is an exercise in which you lie on your back and stretch the muscles that rotate your hip (hip rotators) to stretch your buttocks. Lie on your back on a firm surface. Pull your left / right knee toward your same shoulder with your left / right hand until your knee is pointing toward the  ceiling. Hold your left / right ankle with your other hand. Keeping your knee steady, gently pull your left / right ankle toward your other shoulder until you feel a stretch in your buttocks. Hold this position for __________ seconds. Repeat __________ times. Complete this exercise __________ times a day. Hip extensor This is an exercise in which you lie on your back and pull your knee to your chest. Lie on your back on a firm surface. Both of your legs should be straight. Pull your left / right knee to your chest. Hold your leg in this position by holding on to the back of your thigh or the front of your knee. Hold this position for __________ seconds. Slowly return to the starting position. Repeat __________ times. Complete this exercise __________ times a day. Strengthening exercises These exercises build strength and endurance in your hip and thigh muscles. Endurance is the ability to use your muscles for a long time, even after they get tired. Straight leg raises, side-lying This exercise strengthens the muscles that rotate the leg at the hip and move it away from your body (hip abductors). Lie on your side with your left / right leg in the top position. Lie so your head, shoulder, knee, and hip line up. Bend your bottom knee to help you balance. Lift your top leg 4-6 inches (10-15 cm) while keeping your toes pointed straight ahead. Hold this position for __________ seconds. Slowly lower your leg to the starting position. Let your muscles relax completely after each repetition. Repeat __________ times. Complete this exercise __________ times a day. Hip abduction and rotation This is sometimes called quadruped (on hands and knees) exercises. Get on your hands and knees on a firm, lightly padded surface. Your hands should be directly below your shoulders, and your knees should be directly below your hips. Lift your left / right knee out to the side. Keep your knee bent. Do not twist your  body. Hold this position for __________ seconds. Slowly lower your leg. Repeat __________ times. Complete this exercise __________ times a day. Straight leg raises, prone This exercise stretches the muscles that move the hips (hip extensors). Lie on your abdomen on a firm surface (prone position). Tense the muscles in your buttocks and lift your left / right  leg about 4 inches (10 cm). Keep your knee straight as you lift your leg. If you cannot lift your leg that high without arching your back, place a pillow under your hips. Hold this position for __________ seconds. Slowly lower your leg to the starting position. Let your muscles relax completely after each repetition. Repeat __________ times. Complete this exercise __________ times a day. This information is not intended to replace advice given to you by your health care provider. Make sure you discuss any questions you have with your health care provider. Document Revised: 12/05/2020 Document Reviewed: 12/05/2020 Elsevier Patient Education  2024 [employer].

## 2024-07-16 ENCOUNTER — Ambulatory Visit: Admitting: Gastroenterology

## 2024-07-16 ENCOUNTER — Encounter: Payer: Self-pay | Admitting: Gastroenterology

## 2024-07-16 VITALS — BP 161/73 | HR 67 | Temp 97.7°F | Resp 12 | Ht 67.0 in | Wt 175.0 lb

## 2024-07-16 DIAGNOSIS — Z1211 Encounter for screening for malignant neoplasm of colon: Secondary | ICD-10-CM | POA: Diagnosis present

## 2024-07-16 DIAGNOSIS — K648 Other hemorrhoids: Secondary | ICD-10-CM

## 2024-07-16 DIAGNOSIS — K5730 Diverticulosis of large intestine without perforation or abscess without bleeding: Secondary | ICD-10-CM | POA: Diagnosis not present

## 2024-07-16 MED ORDER — SODIUM CHLORIDE 0.9 % IV SOLN: 500.0000 mL | Freq: Once | INTRAVENOUS | Status: DC

## 2024-07-16 NOTE — Progress Notes (Signed)
 Pt's states no medical or surgical changes since previsit or office visit.

## 2024-07-16 NOTE — Op Note (Signed)
 [name] Procedure Date: 07/16/2024 8:03 AM MRN: [id number] Endoscopist: Gustav ALONSO Mcgee , MD, [id number] Age: 64 Referring MD:  Date of Birth: [birth year] Gender: Female Account #: [id number] Procedure:                Colonoscopy Indications:              Screening for colorectal malignant neoplasm Medicines:                Monitored Anesthesia Care Procedure:                Pre-Anesthesia Assessment:                           - Prior to the procedure, a History and Physical                            was performed, and patient medications and                            allergies were reviewed. The patient's tolerance of                            previous anesthesia was also reviewed. The risks                            and benefits of the procedure and the sedation                            options and risks were discussed with the patient.                            All questions were answered, and informed consent                            was obtained. Prior Anticoagulants: The patient has                            taken no anticoagulant or antiplatelet agents. ASA                            Grade Assessment: II - A patient with mild systemic                            disease. After reviewing the risks and benefits,                            the patient was deemed in satisfactory condition to                            undergo the procedure.                           After obtaining informed consent, the colonoscope  was passed under direct vision. Throughout the                            procedure, the patient's blood pressure, pulse, and                            oxygen saturations were monitored continuously. The                            Olympus Scope [id number] was introduced through the                            anus and advanced to the the cecum, identified by                             appendiceal orifice and ileocecal valve. The                            colonoscopy was performed without difficulty. The                            patient tolerated the procedure well. The quality                            of the bowel preparation was good. The ileocecal                            valve, appendiceal orifice, and rectum were                            photographed. Scope In: 8:30:01 AM Scope Out: 8:44:22 AM Scope Withdrawal Time: 0 hours 7 minutes 33 seconds  Total Procedure Duration: 0 hours 14 minutes 21 seconds  Findings:                 The perianal and digital rectal examinations were                            normal.                           A few small-mouthed diverticula were found in the                            sigmoid colon and ascending colon.                           Non-bleeding internal hemorrhoids were found during                            retroflexion. The hemorrhoids were medium-sized.                           The exam was otherwise without abnormality. Complications:            No immediate complications. Estimated Blood  Loss:     Estimated blood loss: none. Impression:               - Diverticulosis in the sigmoid colon and in the                            ascending colon.                           - Non-bleeding internal hemorrhoids.                           - The examination was otherwise normal.                           - No specimens collected. Recommendation:           - Resume previous diet.                           - Continue present medications.                           - Repeat colonoscopy in 10 years for surveillance. Eimy Plaza V. Jaiyden Laur, MD 07/16/2024 9:03:08 AM This report has been signed electronically.

## 2024-07-16 NOTE — Progress Notes (Signed)
 Report to PACU, RN, vss, BBS= Clear.

## 2024-07-16 NOTE — Patient Instructions (Addendum)
 Resume previous diet. Continue present medications.  Repeat colonoscopy in 10 years. Handout provided on diverticulosis and hemorrhoids.   YOU HAD AN ENDOSCOPIC PROCEDURE TODAY AT THE [facility]:   Refer to the procedure report that was given to you for any specific questions about what was found during the examination.  If the procedure report does not answer your questions, please call your gastroenterologist to clarify.  If you requested that your care partner not be given the details of your procedure findings, then the procedure report has been included in a sealed envelope for you to review at your convenience later.  YOU SHOULD EXPECT: Some feelings of bloating in the abdomen. Passage of more gas than usual.  Walking can help get rid of the air that was put into your GI tract during the procedure and reduce the bloating. If you had a lower endoscopy (such as a colonoscopy or flexible sigmoidoscopy) you may notice spotting of blood in your stool or on the toilet paper. If you underwent a bowel prep for your procedure, you may not have a normal bowel movement for a few days.  Please Note:  You might notice some irritation and congestion in your nose or some drainage.  This is from the oxygen used during your procedure.  There is no need for concern and it should clear up in a day or so.  SYMPTOMS TO REPORT IMMEDIATELY:  Following lower endoscopy (colonoscopy or flexible sigmoidoscopy):  Excessive amounts of blood in the stool  Significant tenderness or worsening of abdominal pains  Swelling of the abdomen that is new, acute  Fever of 100F or higher  For urgent or emergent issues, a gastroenterologist can be reached at any hour by calling [phone]. Do not use MyChart messaging for urgent concerns.    DIET:  We do recommend a small meal at first, but then you may proceed to your regular diet.  Drink plenty of fluids but you should avoid alcoholic beverages for 24  hours.  ACTIVITY:  You should plan to take it easy for the rest of today and you should NOT DRIVE or use heavy machinery until tomorrow (because of the sedation medicines used during the test).    FOLLOW UP: Our staff will call the number listed on your records the next business day following your procedure.  We will call around 7:15- 8:00 am to check on you and address any questions or concerns that you may have regarding the information given to you following your procedure. If we do not reach you, we will leave a message.     If any biopsies were taken you will be contacted by phone or by letter within the next 1-3 weeks.  Please call us  at [phone] if you have not heard about the biopsies in 3 weeks.    SIGNATURES/CONFIDENTIALITY: You and/or your care partner have signed paperwork which will be entered into your electronic medical record.  These signatures attest to the fact that that the information above on your After Visit Summary has been reviewed and is understood.  Full responsibility of the confidentiality of this discharge information lies with you and/or your care-partner.

## 2024-07-19 ENCOUNTER — Ambulatory Visit: Payer: Self-pay | Admitting: Rheumatology

## 2024-07-20 ENCOUNTER — Telehealth: Payer: Self-pay

## 2024-07-20 NOTE — Telephone Encounter (Signed)
 No answer, left message to call if having any issues or concerns, B.Vester Titsworth RN

## 2024-07-21 LAB — COMPREHENSIVE METABOLIC PANEL WITH GFR
AG Ratio: 1.6 (calc) (ref 1.0–2.5)
ALT: 19 U/L (ref 6–29)
AST: 17 U/L (ref 10–35)
Albumin: 4.5 g/dL (ref 3.6–5.1)
Alkaline phosphatase (APISO): 62 U/L (ref 37–153)
BUN/Creatinine Ratio: 11 (calc) (ref 6–22)
BUN: 13 mg/dL (ref 7–25)
CO2: 30 mmol/L (ref 20–32)
Calcium: 9.8 mg/dL (ref 8.6–10.4)
Chloride: 103 mmol/L (ref 98–110)
Creat: 1.18 mg/dL — ABNORMAL HIGH (ref 0.50–1.05)
Globulin: 2.8 g/dL (ref 1.9–3.7)
Glucose, Bld: 90 mg/dL (ref 65–99)
Potassium: 3.9 mmol/L (ref 3.5–5.3)
Sodium: 139 mmol/L (ref 135–146)
Total Bilirubin: 0.5 mg/dL (ref 0.2–1.2)
Total Protein: 7.3 g/dL (ref 6.1–8.1)
eGFR: 51 mL/min/{1.73_m2} — ABNORMAL LOW

## 2024-07-21 LAB — C3 AND C4
C3 Complement: 119 mg/dL (ref 83–193)
C4 Complement: 24 mg/dL (ref 15–57)

## 2024-07-21 LAB — PROTEIN / CREATININE RATIO, URINE
Creatinine, Urine: 58 mg/dL (ref 20–275)
Total Protein, Urine: <4 mg/dL — ABNORMAL LOW (ref 5–24)

## 2024-07-21 LAB — CBC WITH DIFFERENTIAL/PLATELET
Absolute Lymphocytes: 1040 {cells}/uL (ref 850–3900)
Absolute Monocytes: 356 {cells}/uL (ref 200–950)
Basophils Absolute: 32 {cells}/uL (ref 0–200)
Basophils Relative: 0.7 %
Eosinophils Absolute: 72 {cells}/uL (ref 15–500)
Eosinophils Relative: 1.6 %
HCT: 35.2 % — ABNORMAL LOW (ref 35.9–46.0)
Hemoglobin: 11.7 g/dL (ref 11.7–15.5)
MCH: 30.6 pg (ref 27.0–33.0)
MCHC: 33.2 g/dL (ref 31.6–35.4)
MCV: 92.1 fL (ref 81.4–101.7)
MPV: 10.5 fL (ref 7.5–12.5)
Monocytes Relative: 7.9 %
Neutro Abs: 3002 {cells}/uL (ref 1500–7800)
Neutrophils Relative %: 66.7 %
Platelets: 252 10*3/uL (ref 140–400)
RBC: 3.82 Million/uL (ref 3.80–5.10)
RDW: 12 % (ref 11.0–15.0)
Total Lymphocyte: 23.1 %
WBC: 4.5 10*3/uL (ref 3.8–10.8)

## 2024-07-21 LAB — PROTEIN ELECTROPHORESIS, SERUM, WITH REFLEX
Albumin ELP: 4.4 g/dL (ref 3.8–4.8)
Alpha 1: 0.3 g/dL (ref 0.2–0.3)
Alpha 2: 0.6 g/dL (ref 0.5–0.9)
Beta 2: 0.3 g/dL (ref 0.2–0.5)
Beta Globulin: 0.4 g/dL (ref 0.4–0.6)
Gamma Globulin: 1.1 g/dL (ref 0.8–1.7)
Total Protein: 7.1 g/dL (ref 6.1–8.1)

## 2024-07-21 LAB — SEDIMENTATION RATE: Sed Rate: 22 mm/h (ref 0–30)

## 2024-07-21 LAB — SJOGRENS SYNDROME-A EXTRACTABLE NUCLEAR ANTIBODY: SSA (Ro) (ENA) Antibody, IgG: >8 AI — AB

## 2024-07-21 LAB — ANTI-DNA ANTIBODY, DOUBLE-STRANDED: ds DNA Ab: 1 [IU]/mL

## 2024-07-21 LAB — IFE INTERPRETATION

## 2024-07-21 NOTE — Progress Notes (Signed)
 IFE normal.

## 2024-12-14 ENCOUNTER — Ambulatory Visit: Admitting: Rheumatology
